# Patient Record
Sex: Male | Born: 1960 | Race: White | Hispanic: No
Health system: Southern US, Community
[De-identification: ages and names within clinical notes are randomized; demographics above are authoritative.]

## PROBLEM LIST (undated history)

## (undated) DIAGNOSIS — I999 Unspecified disorder of circulatory system: Secondary | ICD-10-CM

## (undated) HISTORY — PX: TONSILLECTOMY: SUR1361

## (undated) HISTORY — PX: ELBOW SURGERY: SHX618

---

## 2005-02-07 ENCOUNTER — Ambulatory Visit (HOSPITAL_BASED_OUTPATIENT_CLINIC_OR_DEPARTMENT_OTHER): Admission: RE | Admit: 2005-02-07 | Discharge: 2005-02-07 | Payer: Self-pay | Admitting: Orthopedic Surgery

## 2007-04-19 ENCOUNTER — Emergency Department (HOSPITAL_COMMUNITY): Admission: EM | Admit: 2007-04-19 | Discharge: 2007-04-19 | Payer: Self-pay | Admitting: *Deleted

## 2007-07-24 ENCOUNTER — Emergency Department (HOSPITAL_COMMUNITY): Admission: EM | Admit: 2007-07-24 | Discharge: 2007-07-24 | Payer: Self-pay | Admitting: Emergency Medicine

## 2010-06-06 ENCOUNTER — Emergency Department (HOSPITAL_COMMUNITY)
Admission: EM | Admit: 2010-06-06 | Discharge: 2010-06-07 | Disposition: A | Discharge status: Home / Self Care | Payer: Self-pay | Attending: Emergency Medicine | Admitting: Emergency Medicine

## 2010-06-06 ENCOUNTER — Emergency Department (HOSPITAL_COMMUNITY): Payer: Self-pay

## 2010-06-06 DIAGNOSIS — R599 Enlarged lymph nodes, unspecified: Secondary | ICD-10-CM | POA: Insufficient documentation

## 2010-06-06 DIAGNOSIS — R1110 Vomiting, unspecified: Secondary | ICD-10-CM | POA: Insufficient documentation

## 2010-06-06 DIAGNOSIS — R05 Cough: Secondary | ICD-10-CM | POA: Insufficient documentation

## 2010-06-06 DIAGNOSIS — E860 Dehydration: Secondary | ICD-10-CM | POA: Insufficient documentation

## 2010-06-06 DIAGNOSIS — R5381 Other malaise: Secondary | ICD-10-CM | POA: Insufficient documentation

## 2010-06-06 DIAGNOSIS — M25429 Effusion, unspecified elbow: Secondary | ICD-10-CM | POA: Insufficient documentation

## 2010-06-06 DIAGNOSIS — R509 Fever, unspecified: Secondary | ICD-10-CM | POA: Insufficient documentation

## 2010-06-06 DIAGNOSIS — R059 Cough, unspecified: Secondary | ICD-10-CM | POA: Insufficient documentation

## 2010-06-06 LAB — URINALYSIS, ROUTINE W REFLEX MICROSCOPIC
Bilirubin Urine: NEGATIVE
Glucose, UA: NEGATIVE mg/dL
Ketones, ur: NEGATIVE mg/dL
pH: 5.5 (ref 5.0–8.0)

## 2010-06-06 LAB — DIFFERENTIAL
Basophils Absolute: 0.1 10*3/uL (ref 0.0–0.1)
Basophils Relative: 1 % (ref 0–1)
Monocytes Absolute: 1.5 10*3/uL — ABNORMAL HIGH (ref 0.1–1.0)
Neutro Abs: 5.4 10*3/uL (ref 1.7–7.7)
Neutrophils Relative %: 54 % (ref 43–77)

## 2010-06-06 LAB — COMPREHENSIVE METABOLIC PANEL
ALT: 27 U/L (ref 0–53)
AST: 51 U/L — ABNORMAL HIGH (ref 0–37)
CO2: 27 mEq/L (ref 19–32)
Calcium: 8.4 mg/dL (ref 8.4–10.5)
GFR calc Af Amer: >60 mL/min (ref >60)
GFR calc non Af Amer: >60 mL/min (ref >60)
Potassium: 5.8 mEq/L — ABNORMAL HIGH (ref 3.5–5.1)
Sodium: 134 mEq/L — ABNORMAL LOW (ref 135–145)
Total Protein: 9.4 g/dL — ABNORMAL HIGH (ref 6.0–8.3)

## 2010-06-06 LAB — CBC
Hemoglobin: 13.7 g/dL (ref 13.0–17.0)
MCHC: 34.2 g/dL (ref 30.0–36.0)
WBC: 9.9 10*3/uL (ref 4.0–10.5)

## 2010-07-18 ENCOUNTER — Other Ambulatory Visit (HOSPITAL_COMMUNITY): Payer: Self-pay | Admitting: Orthopaedic Surgery

## 2010-07-18 DIAGNOSIS — M7989 Other specified soft tissue disorders: Secondary | ICD-10-CM

## 2010-07-21 ENCOUNTER — Other Ambulatory Visit (HOSPITAL_COMMUNITY): Payer: Self-pay

## 2010-07-23 ENCOUNTER — Ambulatory Visit (HOSPITAL_COMMUNITY)
Admission: RE | Admit: 2010-07-23 | Discharge: 2010-07-23 | Disposition: A | Discharge status: Home / Self Care | Payer: Self-pay | Source: Ambulatory Visit | Attending: Orthopaedic Surgery | Admitting: Orthopaedic Surgery

## 2010-07-23 DIAGNOSIS — M7989 Other specified soft tissue disorders: Secondary | ICD-10-CM

## 2010-07-23 DIAGNOSIS — R229 Localized swelling, mass and lump, unspecified: Secondary | ICD-10-CM | POA: Insufficient documentation

## 2010-07-23 MED ORDER — GADOBENATE DIMEGLUMINE 529 MG/ML IV SOLN
13.0000 mL | Freq: Once | INTRAVENOUS | Status: AC | PRN
  Administered 2010-07-23: 13 mL via INTRAVENOUS

## 2010-08-11 ENCOUNTER — Other Ambulatory Visit: Payer: Self-pay | Admitting: Orthopaedic Surgery

## 2010-08-11 ENCOUNTER — Ambulatory Visit (HOSPITAL_COMMUNITY)
Admission: RE | Admit: 2010-08-11 | Discharge: 2010-08-11 | Disposition: A | Discharge status: Home / Self Care | Payer: Self-pay | Source: Ambulatory Visit | Attending: Orthopaedic Surgery | Admitting: Orthopaedic Surgery

## 2010-08-11 DIAGNOSIS — L918 Other hypertrophic disorders of the skin: Secondary | ICD-10-CM | POA: Insufficient documentation

## 2010-08-11 DIAGNOSIS — Z01812 Encounter for preprocedural laboratory examination: Secondary | ICD-10-CM | POA: Insufficient documentation

## 2010-08-11 LAB — CBC
HCT: 38.7 % — ABNORMAL LOW (ref 39.0–52.0)
Hemoglobin: 12.7 g/dL — ABNORMAL LOW (ref 13.0–17.0)
MCH: 29.2 pg (ref 26.0–34.0)
MCHC: 32.8 g/dL (ref 30.0–36.0)
MCV: 89 fL (ref 78.0–100.0)

## 2010-08-11 LAB — BASIC METABOLIC PANEL
BUN: 10 mg/dL (ref 6–23)
CO2: 29 mEq/L (ref 19–32)
Chloride: 103 mEq/L (ref 96–112)
Glucose, Bld: 69 mg/dL — ABNORMAL LOW (ref 70–99)
Potassium: 4.5 mEq/L (ref 3.5–5.1)

## 2010-08-11 LAB — SURGICAL PCR SCREEN: Staphylococcus aureus: NEGATIVE

## 2010-08-13 LAB — WOUND CULTURE

## 2010-08-19 NOTE — Op Note (Signed)
NAME:  Sean [name], Sean [name] NO.:  [id number]   MEDICAL RECORD NO.:  [id number]          PATIENT TYPE:  AMB   LOCATION:  DSC                          FACILITY:  [facility]   PHYSICIAN:  Sean Fitch. [name], M.D. DATE OF BIRTH:  1960/07/08   DATE OF PROCEDURE:  02/07/2005  DATE OF DISCHARGE:                                 OPERATIVE REPORT   PREOPERATIVE DIAGNOSIS:  Septic arthritis, right long finger proximal  interphalangeal joint due to dorsal laceration sustained on February 02, 2005.   POSTOPERATIVE DIAGNOSIS:  Septic arthritis, right long finger proximal  interphalangeal joint due to dorsal laceration sustained on February 02, 2005  with identification of purulent effusion within proximal interphalangeal  joint.   OPERATION:  Incision and drainage of right long finger proximal  interphalangeal joint followed by splinting and voluminous dressing of the  right long finger.   OPERATING SURGEON:  Sean Fitch. Sypher, MD   ASSISTANT:  Annye Rusk PA-C   ANESTHESIA:  2% lidocaine metacarpal head level block of right long finger.  No sedation was provided.  This was performed in the minor operating room  and [facility].   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   On February 02, 2005, he accidentally cut the dorsal aspect of his right long  finger and had copious bleeding.   He cleaned the wound well and protected it with a bandage.  He return to  work.  During the past 48 hours,  beginning on February 05, 2005, he began to  experience throbbing pain in the PIP joint.  Late on the afternoon of  February 06, 2005, he sought an urgent care evaluation and was given a  prescription for amoxicillin.   His tetanus immunization was noted to be up to date.   He subsequently developed throbbing pain in the finger that became very  severe and he returned to [facility]  earlier today and was evaluated by one  of their urgent care physicians.  He was noted to have marked swelling of  his finger, increasing rubor and increasing pain.  He was referred for an  urgent Hand Surgery consult.   We advised him to come directly to the [facility] for  evaluation, anticipating the need to incise and drain his joint.   Sean [name] was interviewed in the holding area and found to have an  obviously infected right long finger PIP joint.   He had eaten breakfast at 6 a.m., therefore we advised him to wait until  after 12:30 p.m. prior to undergoing anesthesia and his surgical procedure.   He is brought to the operating room at this time, anticipating incision and  drainage of his right long finger PIP joint.   PROCEDURE:  Sean [name] was brought to the operating room and placed in  supine position upon the operating table.  Following Betadine prep of his  right palm and dorsal hand, 2% lidocaine was infiltrated at the metacarpal  head level to obtain a digital block.   After  15 minutes, he had complete anesthesia of the right long finger.   The right arm was then prepped with Betadine soap and solution and sterilely  draped.  A pneumatic tourniquet was applied to the proximal right brachium.   Following elevation and compression of his arm, the arterial tourniquet was  inflated to 230 mmHg.   The procedure commenced with extension of his traumatic transverse wound  along the radial dorsal aspect of the finger approximately 15 mm.   The PIP region was explored.  There was an tin-can-lid-type laceration  through the central slip of the extensor mechanism with purulent material  draining from the joint.   A rongeur was used to remove granulation tissue and purulent material,  followed by use of tenotomy scissors to dissect into the capsule of the PIP  joint.   A window was created into dorsal aspect of the PIP joint measuring   approximately 6 mm in length and 5 mm in width.   The joint was then milked with recovery of gross pus.   The joint was then irrigated with approximately 150 mL of sterile saline  using a fine dental needle along the recesses of the proximal phalangeal  head and into the space between the proximal and middle phalanx.  The joint  was repeatedly irrigated and massaged and irrigated until the effluent was  clear.   The wound was then dressed open with Xeroflo, sterile gauze and a voluminous  dressing including an Alumafoam splint.  There were no apparent  complications.   Sean [name] will begin doxycycline 100 mg p.o. twice daily x10 days.  He  will return to our office for followup in 24 hours for whirlpool bath.   Should he show continued signs of sepsis, we may need to perform a formal  incision and drainage in the traditional operating room setting to allow  both a volar and dorsal approach to his finger.   There were no apparent complications.   For aftercare, he is advised to elevate his hand and work on range of motion  exercises to his adjacent fingers.      Sean [name], M.D.  Electronically Signed     RVS/MEDQ  D:  02/07/2005  T:  02/08/2005  Job:  [id number]

## 2010-08-20 NOTE — H&P (Signed)
NAME:  Sean [name] NO.:  [id number]  MEDICAL RECORD NO.:  [id number]           PATIENT TYPE:  O  LOCATION:  SDSC                         FACILITY:  [facility]  PHYSICIAN:  Vanita Panda. Magnus Ivan, M.D.DATE OF BIRTH:  03-24-1961  DATE OF ADMISSION:  08/11/2010 DATE OF DISCHARGE:  08/11/2010                             HISTORY & PHYSICAL   CHIEF COMPLAINT:  Recurrent left elbow mass.  HISTORY OF PRESENT ILLNESS:  Mr. Sean [name] is a 50 year old gentleman was seen in the office.  He has a mass in the antecubital fossa measuring about 3 x 4.7 x 4.7.  It was question whether that this is an abscess.  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  I think he has alcohol history as well.  I was able to place a needle on this in the office and aspirated brown fluid from this, more consistent with infection.  I did sent it off, but anaerobic cultures came back negative.  The aerobic cultures were not done.  He has been on antibiotics.  After decompressing, it did reoccur.  There is suspicion that this is an abscess versus some type of necrotic process, either a chronic lymph node.  We recommended he undergo irrigation and debridement at this standpoint with excision of the mass and possible pathologic identification.  Risks and benefits were explained to him in length and he does wish to proceed with surgery.  CURRENT MEDICATIONS: 1. Ampicillin. 2. Doxycycline. 3. Tramadol.  ALLERGIES:  No known drug allergies.  FAMILY MEDICAL HISTORY:  Heart disease, diabetes, high blood pressure.  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  He currently does not smoke and he does drink, he says twice a week.  He did smell of alcohol when I saw him in the office before.  REVIEW OF SYSTEMS:  Negative for chest pain, shortness of breath, fevers, chills, nausea, or vomiting.  PHYSICAL EXAMINATION:  VITAL SIGNS:  He is afebrile.  Stable vital signs. GENERAL:  He is  alert, awake and oriented x3, in no acute distress or obvious discomfort. HEENT:  Normocephalic and atraumatic.  Pupils equal, round, reactive to light.  Extraocular muscles intact. NECK:  Supple. LUNGS:  Clear to auscultation bilaterally. HEART:  Regular rate and rhythm. ABDOMEN:  Benign. EXTREMITIES:  Right arm shows a probable soft tissue mass in the antecubital fossa just medial side.  He is neurovascularly intact. There is no redness associated with this and some mild pain.  MRI was reviewed and does show a large irregular mass in the fluid collection of the subcutaneous fat in the medial epitrochlear area of the elbow suspicious for abscess versus necrotic lymph node versus hematoma versus necrotic neoplasm.  ASSESSMENT:  This is a 50 year old gentleman with a recurrent right elbow soft tissue mass.  PLAN:  We will take him to the operating room today for exploration of this area with excision and debridement with pathologic identification as necessary.     Vanita Panda. Magnus Ivan, M.D.     CYB/MEDQ  D:  08/11/2010  T:  08/11/2010  Job:  [id number]  Electronically Signed by  Doneen Poisson M.D. on 08/20/2010 07:11:10 PM

## 2010-08-20 NOTE — Op Note (Signed)
  NAME:  DARRAGH, NAY NO.:  [id number]  MEDICAL RECORD NO.:  [id number]           PATIENT TYPE:  O  LOCATION:  SDSC                         FACILITY:  [facility]  PHYSICIAN:  Vanita Panda. Magnus Ivan, M.D.DATE OF BIRTH:  1960-08-24  DATE OF PROCEDURE:  08/11/2010 DATE OF DISCHARGE:  08/11/2010                              OPERATIVE REPORT   PREOPERATIVE DIAGNOSIS:  Right elbow antecubital fossa mass.  POSTOPERATIVE DIAGNOSIS:  Right elbow antecubital fossa mass.  PROCEDURE:  Excision, biopsy, and irrigation and debridement of right elbow tissue mass.  FINDINGS:  Findings consistent with right elbow antecubital fossa abscess versus necrotic lymph node.  SURGEON:  Vanita Panda. Magnus Ivan, MD  ANESTHESIA: 1. General. 2. Local with 0.25% plain Sensorcaine.  BLOOD LOSS:  Less than 100 mL.  COMPLICATIONS:  None.  INDICATIONS:  Mr. Silvera is a 50 year old gentleman with recurrentright elbow soft tissue mass in the antecubital fossa.  He is admitted using IV drugs years ago, but this has not been recent.  He developed this area in the antecubital fossa.  I obtained an MRI and the MRI was consistent with fluid collection in the subcutaneous epitrochlear area consistent with either necrotic lymph node versus abscess.  I aspirated this in the office and found abundant brown fluid.  I sent this for cultures, which were negative, but we also sent these for anaerobic and inadvertently not aerobic.  He presents now for excision of this mass with biopsy and exploration.  He understands the risks and benefits of the surgery as well.  PROCEDURE DESCRIPTION:  After informed consent was obtained, the appropriate right arm was marked.  He was brought to the operating room and placed supine on the operating table with the right arm on an arm table.  General anesthesia was then obtained.  His right arm was prepped and draped with ChloraPrep and sterile drapes.  A time-out  was called to identify correct patient and correct right arm.  I then made an incision directly over the mass and dissected down and once I got through capsular part of it, there was abundant fluid, which we sent for cultures.  There seemed to be necrotic tissue consistent with a lymph node.  I sent them as tissue off for pathologic identification with permanent section.  I then removed some old necrotic tissue and found the remainder of the structures in the medial aspect of the elbow intact.  I thoroughly irrigated the tissue with normal saline solution and then I closed the deep tissue with 2-0 Vicryl suture followed by interrupted 3-0 nylon on the skin.  I infiltrated this with 0.25% plan Sensorcaine.  I placed Xeroform followed by well-padded sterile dressing.  He was awakened, extubated, and taken to the recovery room in stable condition.  All final counts were correct.  No complications noted.     Vanita Panda. Magnus Ivan, M.D.     CYB/MEDQ  D:  08/11/2010  T:  08/12/2010  Job:  [id number]  Electronically Signed by Doneen Poisson M.D. on 08/20/2010 07:11:07 PM

## 2010-12-15 ENCOUNTER — Other Ambulatory Visit (HOSPITAL_COMMUNITY): Payer: PRIVATE HEALTH INSURANCE

## 2010-12-22 ENCOUNTER — Ambulatory Visit (HOSPITAL_COMMUNITY)
Admission: RE | Admit: 2010-12-22 | Payer: PRIVATE HEALTH INSURANCE | Source: Ambulatory Visit | Admitting: Otolaryngology

## 2010-12-27 LAB — URINE MICROSCOPIC-ADD ON

## 2010-12-27 LAB — URINALYSIS, ROUTINE W REFLEX MICROSCOPIC
Glucose, UA: NEGATIVE
Hgb urine dipstick: NEGATIVE
Protein, ur: 30 — AB

## 2010-12-27 LAB — INFLUENZA A+B VIRUS AG-DIRECT(RAPID)

## 2011-12-24 ENCOUNTER — Emergency Department (HOSPITAL_COMMUNITY)
Admission: EM | Admit: 2011-12-24 | Discharge: 2011-12-24 | Disposition: A | Discharge status: Home / Self Care | Payer: Self-pay | Attending: Emergency Medicine | Admitting: Emergency Medicine

## 2011-12-24 ENCOUNTER — Encounter (HOSPITAL_COMMUNITY): Payer: Self-pay | Admitting: *Deleted

## 2011-12-24 DIAGNOSIS — S2341XA Sprain of ribs, initial encounter: Secondary | ICD-10-CM | POA: Insufficient documentation

## 2011-12-24 DIAGNOSIS — S39012A Strain of muscle, fascia and tendon of lower back, initial encounter: Secondary | ICD-10-CM

## 2011-12-24 DIAGNOSIS — Y9389 Activity, other specified: Secondary | ICD-10-CM | POA: Insufficient documentation

## 2011-12-24 DIAGNOSIS — X500XXA Overexertion from strenuous movement or load, initial encounter: Secondary | ICD-10-CM | POA: Insufficient documentation

## 2011-12-24 DIAGNOSIS — S29011A Strain of muscle and tendon of front wall of thorax, initial encounter: Secondary | ICD-10-CM

## 2011-12-24 DIAGNOSIS — S339XXA Sprain of unspecified parts of lumbar spine and pelvis, initial encounter: Secondary | ICD-10-CM | POA: Insufficient documentation

## 2011-12-24 DIAGNOSIS — Y998 Other external cause status: Secondary | ICD-10-CM | POA: Insufficient documentation

## 2011-12-24 MED ORDER — CYCLOBENZAPRINE HCL 5 MG PO TABS: 5.0000 mg | ORAL_TABLET | Freq: Three times a day (TID) | ORAL | Status: DC | PRN

## 2011-12-24 MED ORDER — IBUPROFEN 800 MG PO TABS: 800.0000 mg | ORAL_TABLET | Freq: Three times a day (TID) | ORAL | Status: DC | PRN

## 2011-12-24 MED ORDER — IBUPROFEN 400 MG PO TABS
800.0000 mg | ORAL_TABLET | Freq: Once | ORAL | Status: AC
  Administered 2011-12-24: 800 mg via ORAL
  Filled 2011-12-24: qty 2

## 2011-12-24 MED ORDER — OXYCODONE-ACETAMINOPHEN 5-325 MG PO TABS: ORAL_TABLET | ORAL | Status: DC

## 2011-12-24 MED ORDER — OXYCODONE-ACETAMINOPHEN 5-325 MG PO TABS
2.0000 | ORAL_TABLET | Freq: Once | ORAL | Status: AC
  Administered 2011-12-24: 2 via ORAL
  Filled 2011-12-24: qty 2

## 2011-12-24 NOTE — ED Provider Notes (Signed)
History   This chart was scribed for Sean Numbers, MD by Melba Coon. The patient was seen in room TR09C/TR09C and the patient's care was started at 2:33PM.    CSN: [id number]  Arrival date & time 12/24/11  1358   First MD Initiated Contact with Patient 12/24/11 1427      Chief Complaint  Patient presents with  . Back Pain  . Generalized Body Aches    (Consider location/radiation/quality/duration/timing/severity/associated sxs/prior treatment) The history is provided by the patient. No language interpreter was used.   Sean [name] is a 51 y.o. male who presents to the Emergency Department complaining of constant, moderate to severe bilateral lower back pain with associated chest pain with an onset 3 days ago. [name] was pulling a furnace when he aggravated his back and chest. Pt rates the severity of the pain an 8/10. Ibuprofen slightly, but not completely alleviates the pain. Laying down aggravates the pain. No incontinence, bowel or bladder dysfunction. No extremity pain, swelling, numbness or tingling. No other pertinent medical symptoms.  History reviewed. No pertinent past medical history.  Past Surgical History  Procedure Date  . Elbow surgery   . Tonsillectomy     History reviewed. No pertinent family history.  History  Substance Use Topics  . Smoking status: Never Smoker   . Smokeless tobacco: Not on file  . Alcohol Use: Yes      Review of Systems 10 Systems reviewed and all are negative for acute change except as noted in the HPI.   Allergies  Motrin makes him nauseous.  Home Medications  No current outpatient prescriptions on file.  BP 147/88  Pulse 90  Temp 98.2 F (36.8 C) (Oral)  Resp 20  SpO2 98%  Physical Exam GEN: Well-developed, well-nourished male in mild distress HEENT: Atraumatic, normocephalic. Oropharynx clear without erythema EYES: PERRLA BL, no scleral icterus. NECK: Trachea midline, no meningismus CV: regular rate  and rhythm. No murmurs, rubs, or gallops PULM: Reproducible CP/TTP chest bilaterally that is worse with lifting. No respiratory distress.  No crackles, wheezes, or rales. GI: soft, non-tender. No guarding, rebound, or tenderness. + bowel sounds GU: deferred Neuro: cranial nerves grossly 2-12 intact, no abnormalities of strength or sensation, A and O x 3 MSK: Moderate paraspinal tenderness in lumbosacral area. Patient moves all 4 extremities symmetrically, no deformity, edema, or injury noted Skin: No rashes petechiae, purpura, or jaundice Psych: no abnormality of mood  ED Course  Procedures (including critical care time)  DIAGNOSTIC STUDIES: Oxygen Saturation is 98% on room air, normal by my interpretation.    COORDINATION OF CARE:  2:38PM - Ibuprofen, flexeril 5mg  30 tabs, and percocet 30 tabs will be Rx for the pt. Mr happ is ready for d/c.   Labs Reviewed - No data to display No results found.   1. Muscle strain of chest wall   2. Lumbosacral strain       MDM  Patient complained of muscular strain in chest wall and back following lifting a heavy HVAC unit.  I was comfortable that this was not ACS or other emergent causes of CP as was patient.  Patient denied new cough or fevers and was hemodynamically stable.  He was treated symptomatically and prescribed flexeril and percocet as well as ibuprofen.  Also given PCP referral list.  No neurologic findings or incontinence to suggest need for imaging today.    I personally performed the services described in this documentation, which was scribed in my presence. The  recorded information has been reviewed and considered.           Sean Numbers, MD 12/25/11 1011

## 2011-12-24 NOTE — ED Notes (Signed)
Patient states he was pulling a furnace thru an attic hole on Thursday.  He thinks he pulled something in his back.  He has pain in both side of his chest at times as well.  Patient also complains of leg pain.

## 2012-01-09 ENCOUNTER — Emergency Department (HOSPITAL_COMMUNITY): Payer: Self-pay

## 2012-01-09 ENCOUNTER — Encounter (HOSPITAL_COMMUNITY): Payer: Self-pay

## 2012-01-09 ENCOUNTER — Emergency Department (HOSPITAL_COMMUNITY)
Admission: EM | Admit: 2012-01-09 | Discharge: 2012-01-09 | Disposition: A | Discharge status: Home / Self Care | Payer: Self-pay | Attending: Emergency Medicine | Admitting: Emergency Medicine

## 2012-01-09 DIAGNOSIS — M2550 Pain in unspecified joint: Secondary | ICD-10-CM

## 2012-01-09 DIAGNOSIS — M79609 Pain in unspecified limb: Secondary | ICD-10-CM | POA: Insufficient documentation

## 2012-01-09 LAB — CBC WITH DIFFERENTIAL/PLATELET
Basophils Absolute: 0 10*3/uL (ref 0.0–0.1)
HCT: 40.3 % (ref 39.0–52.0)
Lymphocytes Relative: 18 % (ref 12–46)
Monocytes Absolute: 0.7 10*3/uL (ref 0.1–1.0)
Neutro Abs: 4.6 10*3/uL (ref 1.7–7.7)
Neutrophils Relative %: 69 % (ref 43–77)
Platelets: 452 10*3/uL — ABNORMAL HIGH (ref 150–400)
RDW: 13.5 % (ref 11.5–15.5)
WBC: 6.7 10*3/uL (ref 4.0–10.5)

## 2012-01-09 LAB — COMPREHENSIVE METABOLIC PANEL
ALT: 26 U/L (ref 0–53)
AST: 31 U/L (ref 0–37)
CO2: 29 mEq/L (ref 19–32)
Chloride: 100 mEq/L (ref 96–112)
GFR calc non Af Amer: >90 mL/min (ref >90)
Potassium: 4.2 mEq/L (ref 3.5–5.1)
Sodium: 136 mEq/L (ref 135–145)
Total Bilirubin: 0.3 mg/dL (ref 0.3–1.2)

## 2012-01-09 MED ORDER — OXYCODONE-ACETAMINOPHEN 5-325 MG PO TABS: 1.0000 | ORAL_TABLET | Freq: Four times a day (QID) | ORAL | Status: DC | PRN

## 2012-01-09 NOTE — ED Notes (Signed)
Pt complains of pain in all joints, had surgery a long time ago and fingers are cold. sts cant rest at night due to pain and cold hands.

## 2012-01-09 NOTE — ED Provider Notes (Signed)
History  Scribed for Sean Lennert, MD, the patient was seen in room TR05C/TR05C. This chart was scribed by Candelaria Stagers. The patient's care started at 3:49 PM   CSN: [id number]  Arrival date & time 01/09/12  1418   First MD Initiated Contact with Patient 01/09/12 1546      Chief Complaint  Patient presents with  . Joint Pain     Patient is a 51 y.o. male presenting with hand pain. The history is provided by the patient. No language interpreter was used.  Hand Pain This is a chronic problem. The current episode started more than 1 week ago. The problem occurs constantly. The problem has been gradually worsening. Pertinent negatives include no chest pain, no abdominal pain and no headaches. Nothing relieves the symptoms.   Sean [name] is a 51 y.o. male who presents to the Emergency Department complaining of joint pain all over that started about two weeks ago.  Pt states that his fingers started to feel cold about one week ago.  He is also waking up at night due to finger pain which has worsened over the past few days.  He has h/o elbow surgery to the right elbow.  He also reports drooping of his left eyelid.  Nothing seems to make the sx better or worse.    No past medical history on file.  Past Surgical History  Procedure Date  . Elbow surgery   . Tonsillectomy     No family history on file.  History  Substance Use Topics  . Smoking status: Never Smoker   . Smokeless tobacco: Not on file  . Alcohol Use: Yes      Review of Systems  Constitutional: Negative for fatigue.  HENT: Negative for congestion, sinus pressure and ear discharge.        Left eyelid drooping  Eyes: Negative for discharge.  Respiratory: Negative for cough.   Cardiovascular: Negative for chest pain.  Gastrointestinal: Negative for abdominal pain and diarrhea.  Genitourinary: Negative for frequency and hematuria.  Musculoskeletal: Positive for joint swelling (index fingers bilaterally )  and arthralgias (index fingers bilaterally). Negative for back pain.  Skin: Negative for rash.  Neurological: Negative for seizures and headaches.  Hematological: Negative.   Psychiatric/Behavioral: Negative for hallucinations.    Allergies  Review of patient's allergies indicates no known allergies.  Home Medications   Current Outpatient Rx  Name Route Sig Dispense Refill  . BC HEADACHE POWDER PO Oral Take 1 Package by mouth daily as needed. For headache    . CYCLOBENZAPRINE HCL 5 MG PO TABS Oral Take 5 mg by mouth 3 (three) times daily as needed. For pain    . IBUPROFEN 800 MG PO TABS Oral Take 800 mg by mouth every 8 (eight) hours as needed. For pain    . OXYCODONE-ACETAMINOPHEN 5-325 MG PO TABS Oral Take 0.5-1 tablets by mouth every 6 (six) hours as needed. for pain.      BP 156/101  Pulse 108  Temp 98.1 F (36.7 C) (Oral)  Resp 20  SpO2 100%  Physical Exam  Constitutional: He is oriented to person, place, and time. He appears well-developed.  HENT:  Head: Normocephalic.  Eyes: Conjunctivae normal are normal.  Neck: No tracheal deviation present.  Cardiovascular:  No murmur heard. Musculoskeletal: Normal range of motion.       Ptosis of eyelid on the left.  DIP joints of both index fingers are swollen and tender.   Neurological: He is oriented  to person, place, and time.  Skin: Skin is warm.  Psychiatric: He has a normal mood and affect.    ED Course  Procedures   DIAGNOSTIC STUDIES: Oxygen Saturation is 100% on room air, normal by my interpretation.    COORDINATION OF CARE:  15:54 Ordered: DG Finger Index Right; CBC with Differential; Comprehensive metabolic panel 5:47 PM Discussed results and course of care including f/u with PCP  Labs Reviewed  CBC WITH DIFFERENTIAL - Abnormal; Notable for the following:    Platelets 452 (*)     All other components within normal limits  COMPREHENSIVE METABOLIC PANEL - Abnormal; Notable for the following:    Total  Protein 9.1 (*)     Albumin 3.1 (*)     All other components within normal limits   Dg Finger Index Right  01/09/2012  *RADIOLOGY REPORT*  Clinical Data: Pain  RIGHT INDEX FINGER 2+V  Comparison: None.  Findings:  Frontal, oblique, and lateral views were obtained.  No fracture or dislocation.  Joint spaces appear intact.  No erosive change.  IMPRESSION:  No abnormality noted.   Original Report Authenticated By: Arvin Collard. WOODRUFF III, M.D.      No diagnosis found.    MDM   The chart was scribed for me under my direct supervision.  I personally performed the history, physical, and medical decision making and all procedures in the evaluation of this patient.Sean Lennert, MD 01/09/12 1750

## 2012-01-12 ENCOUNTER — Encounter (HOSPITAL_COMMUNITY): Payer: Self-pay | Admitting: *Deleted

## 2012-01-12 ENCOUNTER — Emergency Department (HOSPITAL_COMMUNITY)
Admission: EM | Admit: 2012-01-12 | Discharge: 2012-01-12 | Discharge status: Against Medical Advice | Payer: Self-pay | Attending: Emergency Medicine | Admitting: Emergency Medicine

## 2012-01-12 DIAGNOSIS — I998 Other disorder of circulatory system: Secondary | ICD-10-CM

## 2012-01-12 DIAGNOSIS — M79609 Pain in unspecified limb: Secondary | ICD-10-CM | POA: Insufficient documentation

## 2012-01-12 MED ORDER — OXYCODONE-ACETAMINOPHEN 10-325 MG PO TABS: 1.0000 | ORAL_TABLET | ORAL | Status: DC | PRN

## 2012-01-12 NOTE — ED Notes (Signed)
Pt is back here for severe pain in his bilateral index fingertips.  Pts tips of his fingers are cool to the touch and appear to have very small bruising to them

## 2012-01-13 ENCOUNTER — Encounter (HOSPITAL_COMMUNITY): Payer: Self-pay | Admitting: Emergency Medicine

## 2012-01-13 ENCOUNTER — Other Ambulatory Visit: Payer: Self-pay

## 2012-01-13 ENCOUNTER — Inpatient Hospital Stay (HOSPITAL_COMMUNITY)
Admission: EM | Admit: 2012-01-13 | Discharge: 2012-01-19 | DRG: 303 | Disposition: A | Discharge status: Home / Self Care | Payer: MEDICAID | Attending: Internal Medicine | Admitting: Internal Medicine

## 2012-01-13 DIAGNOSIS — F1910 Other psychoactive substance abuse, uncomplicated: Secondary | ICD-10-CM | POA: Diagnosis present

## 2012-01-13 DIAGNOSIS — I776 Arteritis, unspecified: Secondary | ICD-10-CM | POA: Diagnosis present

## 2012-01-13 DIAGNOSIS — I998 Other disorder of circulatory system: Secondary | ICD-10-CM | POA: Diagnosis present

## 2012-01-13 DIAGNOSIS — F1210 Cannabis abuse, uncomplicated: Secondary | ICD-10-CM | POA: Diagnosis present

## 2012-01-13 DIAGNOSIS — B1920 Unspecified viral hepatitis C without hepatic coma: Secondary | ICD-10-CM | POA: Diagnosis present

## 2012-01-13 DIAGNOSIS — R404 Transient alteration of awareness: Secondary | ICD-10-CM | POA: Diagnosis not present

## 2012-01-13 DIAGNOSIS — IMO0002 Reserved for concepts with insufficient information to code with codable children: Secondary | ICD-10-CM | POA: Diagnosis not present

## 2012-01-13 DIAGNOSIS — Z7982 Long term (current) use of aspirin: Secondary | ICD-10-CM

## 2012-01-13 DIAGNOSIS — F1410 Cocaine abuse, uncomplicated: Secondary | ICD-10-CM | POA: Diagnosis present

## 2012-01-13 DIAGNOSIS — M79609 Pain in unspecified limb: Secondary | ICD-10-CM

## 2012-01-13 DIAGNOSIS — R000 Tachycardia, unspecified: Secondary | ICD-10-CM | POA: Diagnosis not present

## 2012-01-13 DIAGNOSIS — F1010 Alcohol abuse, uncomplicated: Secondary | ICD-10-CM | POA: Diagnosis present

## 2012-01-13 DIAGNOSIS — M2550 Pain in unspecified joint: Secondary | ICD-10-CM | POA: Diagnosis present

## 2012-01-13 DIAGNOSIS — R509 Fever, unspecified: Secondary | ICD-10-CM | POA: Diagnosis not present

## 2012-01-13 DIAGNOSIS — I999 Unspecified disorder of circulatory system: Secondary | ICD-10-CM

## 2012-01-13 DIAGNOSIS — R9430 Abnormal result of cardiovascular function study, unspecified: Secondary | ICD-10-CM

## 2012-01-13 DIAGNOSIS — I731 Thromboangiitis obliterans [Buerger's disease]: Secondary | ICD-10-CM | POA: Diagnosis present

## 2012-01-13 DIAGNOSIS — Z791 Long term (current) use of non-steroidal anti-inflammatories (NSAID): Secondary | ICD-10-CM

## 2012-01-13 DIAGNOSIS — D698 Other specified hemorrhagic conditions: Secondary | ICD-10-CM | POA: Diagnosis present

## 2012-01-13 LAB — PROTIME-INR
INR: 1.15 (ref 0.00–1.49)
Prothrombin Time: 14.5 seconds (ref 11.6–15.2)

## 2012-01-13 LAB — CBC
HCT: 39.4 % (ref 39.0–52.0)
Hemoglobin: 12.7 g/dL — ABNORMAL LOW (ref 13.0–17.0)
MCH: 28.7 pg (ref 26.0–34.0)
MCHC: 32.2 g/dL (ref 30.0–36.0)

## 2012-01-13 LAB — APTT: aPTT: 55 seconds — ABNORMAL HIGH (ref 24–37)

## 2012-01-13 LAB — RAPID URINE DRUG SCREEN, HOSP PERFORMED
Opiates: NOT DETECTED
Tetrahydrocannabinol: NOT DETECTED

## 2012-01-13 MED ORDER — HEPARIN BOLUS VIA INFUSION
3000.0000 [IU] | Freq: Once | INTRAVENOUS | Status: AC
  Administered 2012-01-13: 3000 [IU] via INTRAVENOUS

## 2012-01-13 MED ORDER — OXYCODONE-ACETAMINOPHEN 5-325 MG PO TABS
1.0000 | ORAL_TABLET | ORAL | Status: DC | PRN
  Administered 2012-01-14 – 2012-01-19 (×22): 1 via ORAL
  Filled 2012-01-13 (×22): qty 1

## 2012-01-13 MED ORDER — ACETAMINOPHEN 325 MG PO TABS
650.0000 mg | ORAL_TABLET | Freq: Four times a day (QID) | ORAL | Status: DC | PRN
  Administered 2012-01-14: 650 mg via ORAL
  Filled 2012-01-13 (×2): qty 2

## 2012-01-13 MED ORDER — OXYCODONE-ACETAMINOPHEN 10-325 MG PO TABS: 1.0000 | ORAL_TABLET | ORAL | Status: DC | PRN

## 2012-01-13 MED ORDER — OXYCODONE HCL 5 MG PO TABS
5.0000 mg | ORAL_TABLET | ORAL | Status: DC | PRN
  Administered 2012-01-14 – 2012-01-19 (×18): 5 mg via ORAL
  Filled 2012-01-13 (×13): qty 1

## 2012-01-13 MED ORDER — HEPARIN (PORCINE) IN NACL 100-0.45 UNIT/ML-% IJ SOLN
1150.0000 [IU]/h | INTRAMUSCULAR | Status: DC
  Administered 2012-01-13: 900 [IU]/h via INTRAVENOUS
  Administered 2012-01-14: 1150 [IU]/h via INTRAVENOUS
  Filled 2012-01-13 (×3): qty 250

## 2012-01-13 MED ORDER — ALUM & MAG HYDROXIDE-SIMETH 200-200-20 MG/5ML PO SUSP: 30.0000 mL | Freq: Four times a day (QID) | ORAL | Status: DC | PRN

## 2012-01-13 MED ORDER — SENNOSIDES-DOCUSATE SODIUM 8.6-50 MG PO TABS: 1.0000 | ORAL_TABLET | Freq: Every evening | ORAL | Status: DC | PRN

## 2012-01-13 MED ORDER — ACETAMINOPHEN 650 MG RE SUPP: 650.0000 mg | Freq: Four times a day (QID) | RECTAL | Status: DC | PRN

## 2012-01-13 MED ORDER — HYDROCODONE-ACETAMINOPHEN 5-325 MG PO TABS
2.0000 | ORAL_TABLET | Freq: Once | ORAL | Status: AC
  Administered 2012-01-13: 2 via ORAL
  Filled 2012-01-13: qty 2

## 2012-01-13 MED ORDER — MORPHINE SULFATE 4 MG/ML IJ SOLN
4.0000 mg | INTRAMUSCULAR | Status: DC | PRN
  Administered 2012-01-13 – 2012-01-16 (×10): 4 mg via INTRAVENOUS
  Filled 2012-01-13 (×11): qty 1

## 2012-01-13 MED ORDER — SODIUM CHLORIDE 0.9 % IV SOLN
INTRAVENOUS | Status: DC
  Administered 2012-01-13: via INTRAVENOUS
  Administered 2012-01-14 (×2): 125 mL via INTRAVENOUS
  Administered 2012-01-15: 11:00:00 via INTRAVENOUS
  Administered 2012-01-16: 20 mL/h via INTRAVENOUS

## 2012-01-13 MED ORDER — OXYCODONE HCL 5 MG PO TABS
5.0000 mg | ORAL_TABLET | ORAL | Status: DC | PRN
  Administered 2012-01-14 – 2012-01-19 (×6): 5 mg via ORAL
  Filled 2012-01-13 (×10): qty 1

## 2012-01-13 MED ORDER — ZOLPIDEM TARTRATE 5 MG PO TABS
5.0000 mg | ORAL_TABLET | Freq: Every evening | ORAL | Status: DC | PRN
  Administered 2012-01-15: 5 mg via ORAL
  Filled 2012-01-13 (×2): qty 1

## 2012-01-13 MED ORDER — ONDANSETRON HCL 4 MG PO TABS: 4.0000 mg | ORAL_TABLET | Freq: Four times a day (QID) | ORAL | Status: DC | PRN

## 2012-01-13 MED ORDER — ONDANSETRON HCL 4 MG/2ML IJ SOLN: 4.0000 mg | Freq: Four times a day (QID) | INTRAMUSCULAR | Status: DC | PRN

## 2012-01-13 MED ORDER — SODIUM CHLORIDE 0.9 % IJ SOLN
3.0000 mL | Freq: Two times a day (BID) | INTRAMUSCULAR | Status: DC
  Administered 2012-01-14 – 2012-01-19 (×9): 3 mL via INTRAVENOUS

## 2012-01-13 NOTE — ED Notes (Signed)
Lab at bedside

## 2012-01-13 NOTE — ED Notes (Signed)
Pt requesting pain medication, states "this pain is unbearable, you guys have to do something about this."

## 2012-01-13 NOTE — Progress Notes (Signed)
ANTICOAGULATION CONSULT NOTE - Initial Consult  Pharmacy Consult for Heparin Indication: Finger ischemia  No Known Allergies  Patient Measurements: Height: 5\' 5"  (165.1 cm) Weight: 140 lb (63.504 kg) IBW/kg (Calculated) : 61.5  Heparin Dosing Weight: 63 kg  Vital Signs: Temp: 97.6 F (36.4 C) (10/12 1348) Temp src: Oral (10/12 1348) BP: 130/87 mmHg (10/12 1348) Pulse Rate: 91  (10/12 1348)  Labs: No results found for this basename: HGB:2,HCT:3,PLT:3,APTT:3,LABPROT:3,INR:3,HEPARINUNFRC:3,CREATININE:3,CKTOTAL:3,CKMB:3,TROPONINI:3 in the last 72 hours  Estimated Creatinine Clearance: 89.5 ml/min (by C-G formula based on Cr of 0.84).   Medical History: History reviewed. No pertinent past medical history.   Assessment: 51 y.o. M with repeat presentations to the [facility] with complaints of finger pain. Now noted to have some swelling/discoloration. Pharmacy consulted to start heparin for finger ischemia. Per nurse report, pt states that his last surgery was "vein surgery" about 1.5 years ago. Hep wt: 63.5 kg  CBC not yet crossed into EPIC (system down) but per discussion with hematology: Hgb 12.7, Hct 39.4, Plt 495.   Goal of Therapy:  Heparin level 0.3-0.7 units/ml Monitor platelets by anticoagulation protocol: Yes   Plan:  1. Heparin bolus of 3000 units x 1  2. Initiate heparin drip at rate of 900 units/hr (9 ml/hr) 3. Will continue to monitor for any signs/symptoms of bleeding and will follow up with heparin level in 6 hours   Georgina Pillion, PharmD, BCPS Clinical Pharmacist Pager: [phone] 01/13/2012 6:30 PM

## 2012-01-13 NOTE — H&P (Signed)
PCP:   None   Chief Complaint:  Pain bilateral fingers  HPI: This is a healthy 51 year old gentleman who states that approximately 2 weeks ago he developed pain in the bilateral index fingers, pain was severe enough to provide sleep. He states the fingers were changing colors and meaning becoming a bluish hue. Pain has continued to be severe to today. He was in here 2 days ago with the same complaint, blood work and x-rays that were negative he was discharged home. He came by to see her last night, her ultrasound was done revealing decreased flow. 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 His back to the ER today. Admission the patient complains of generalized aches and pains severe in the back bending on the legs, severe enough to bring him from going to work for the past 2 weeks. He states this is also new. He denies any fevers or chills. He states he been having some episodes of nausea vomiting.   Review of Systems:  The patient denies anorexia, fever, weight loss,, vision loss, decreased hearing, hoarseness, chest pain, syncope, dyspnea on exertion, peripheral edema, balance deficits, hemoptysis, abdominal pain, melena, hematochezia, severe indigestion/heartburn, hematuria, incontinence, genital sores, muscle weakness, suspicious skin lesions, transient blindness, difficulty walking, depression, unusual weight change, abnormal bleeding, enlarged lymph nodes, angioedema, and breast masses.  Past Medical History: History reviewed. No pertinent past medical history. Past Surgical History  Procedure Date  . Elbow surgery   . Tonsillectomy     Medications: Prior to Admission medications   Medication Sig Start Date End Date Taking? Authorizing Provider  Aspirin-Salicylamide-Caffeine (BC HEADACHE POWDER PO) Take 1 Package by mouth daily as needed. For headache   Yes Historical Provider, MD    ibuprofen (ADVIL,MOTRIN) 800 MG tablet Take 800 mg by mouth every 8 (eight) hours as needed. For pain 12/24/11  Yes Cyndra Numbers, MD  oxyCODONE-acetaminophen (PERCOCET) 10-325 MG per tablet Take 1 tablet by mouth every 4 (four) hours as needed for pain. 01/12/12  Yes Flint Melter, MD    Allergies:  No Known Allergies  Social History:  reports that he has never smoked. He does not have any smokeless tobacco history on file. He reports that he drinks alcohol. He reports that he does not use illicit drugs.  Family History: No family history on file.  Physical Exam: Filed Vitals:   01/13/12 1348 01/13/12 1814  BP: 130/87   Pulse: 91   Temp: 97.6 F (36.4 C)   TempSrc: Oral   Resp: 18   Height:  5\' 5"  (1.651 m)  Weight:  63.504 kg (140 lb)  SpO2: 100%     General:  Alert and oriented times three, well developed and nourished, no acute distress Eyes: PERRLA, pink conjunctiva, no scleral icterus ENT: Moist oral mucosa, neck supple, no thyromegaly Lungs: clear to ascultation, no wheeze, no crackles, no use of accessory muscles Cardiovascular: regular rate and rhythm, no regurgitation, no gallops, no murmurs. No carotid bruits, no JVD Abdomen: soft, positive BS, non-tender, non-distended, no organomegaly, not an acute abdomen GU: not examined Neuro: CN II - XII grossly intact, sensation intact Musculoskeletal: strength 5/5 all extremities, no clubbing, cyanosis or edema Skin: no rash, no subcutaneous crepitation, no decubitus, left and right index fingers blue at the tips, cool and tender to touch, good radial pulses Psych: appropriate patient   Labs on Admission:  No results  found for this basename: NA:2,K:2,CL:2,CO2:2,GLUCOSE:2,BUN:2,CREATININE:2,CALCIUM:2,MG:2,PHOS:2 in the last 72 hours No results found for this basename: AST:2,ALT:2,ALKPHOS:2,BILITOT:2,PROT:2,ALBUMIN:2 in the last 72 hours No results found for this basename: LIPASE:2,AMYLASE:2 in the last 72 hours No  results found for this basename: WBC:2,NEUTROABS:2,HGB:2,HCT:2,MCV:2,PLT:2 in the last 72 hours No results found for this basename: CKTOTAL:3,CKMB:3,CKMBINDEX:3,TROPONINI:3 in the last 72 hours No components found with this basename: POCBNP:3 No results found for this basename: DDIMER:2 in the last 72 hours No results found for this basename: HGBA1C:2 in the last 72 hours No results found for this basename: CHOL:2,HDL:2,LDLCALC:2,TRIG:2,CHOLHDL:2,LDLDIRECT:2 in the last 72 hours No results found for this basename: TSH,T4TOTAL,FREET3,T3FREE,THYROIDAB in the last 72 hours No results found for this basename: VITAMINB12:2,FOLATE:2,FERRITIN:2,TIBC:2,IRON:2,RETICCTPCT:2 in the last 72 hours  Micro Results: No results found for this or any previous visit (from the past 240 hour(s)).   Radiological Exams on Admission: No results found.  Assessment/Plan Present on Admission:  .Ischemic bilateral finger Admit to  MedSurg Vascular surgeon Dr. Elliot Cousin aware. Will see patient in a.m. Angiogram ordered on heparin drip Pain medications as needed Nonspecific generalized aches and pains ESR and C-reactive protein ordered as well as Lyme's disease   please note: The labs are currently down  Full code No need for DVT prophylaxis   Ashyla Luth 01/13/2012, 9:27 PM

## 2012-01-13 NOTE — ED Notes (Signed)
Report called to [facility] on 5500.

## 2012-01-13 NOTE — ED Notes (Signed)
Pt. Stated, I was here 2 nights ago.  Fingers is not having any blood flow to fingers.  Here last night and the Dr. York Spaniel, I was not getting any blood flow to my finger tips. Dr. Catalina Pizza him to come back today.

## 2012-01-13 NOTE — ED Provider Notes (Signed)
0730 report received from Dr. not on file the palm this 51 year old male with worsening pain in his bilateral index finger tips with discoloration. Good sensation and movement to the fingers. States that this has been gradually getting worse over the last 2 weeks. He has been here a couple times for this problem. States that his trouble began 3 weeks ago when he had chest and back pain trying to cool air conditioning unit out of an attic. Patient denies shortness of breath calf pain or long trips. Patient was given heparin 300units in fast track.  Dr. Jed Limerick here to admit the patient.  Remi Haggard, NP 01/13/12 2213

## 2012-01-13 NOTE — ED Notes (Signed)
Hospital medicine in to evaluate pt

## 2012-01-13 NOTE — ED Provider Notes (Addendum)
History    This chart was scribed for Derwood Kaplan, MD, MD by Smitty Pluck. The patient was seen in room TR04C and the patient's care was started at 3:33PM.   CSN: [id number]  Arrival date & time 01/13/12  1340   None     No chief complaint on file.   (Consider location/radiation/quality/duration/timing/severity/associated sxs/prior treatment) The history is provided by the patient. No language interpreter was used.   Sean [name] is a 51 y.o. male who presents to the Emergency Department complaining of constant, moderate numbness of fingers in bilateral hands onset 1.5 weeks ago. Reports gradual onset with right index finger having the most pain. He denies aggravating and relieving factors. Reports applying peroxide and alcohol without relief. He states he has discoloration of fingers. Pt reports that he was in ED 2 days ago and 1 day ago due to same symptoms and had xray of hands. Pt reports that he was discharged with percocet. He reports he had numb "pins and needles" sensation after discharge. Pt reports taking BC. Pt denies any other medical problems. He reports hx of right elbow surgery 1 year ago and states that he noticed numbness in right fingers intermittently.  Denies smoking cigarettes. Pt was told to come back today by physician that examined him 1 day ago.   History reviewed. No pertinent past medical history.  Past Surgical History  Procedure Date  . Elbow surgery   . Tonsillectomy     No family history on file.  History  Substance Use Topics  . Smoking status: Never Smoker   . Smokeless tobacco: Not on file  . Alcohol Use: Yes      Review of Systems  Neurological: Positive for numbness.  All other systems reviewed and are negative.    Allergies  Review of patient's allergies indicates no known allergies.  Home Medications   Current Outpatient Rx  Name Route Sig Dispense Refill  . BC HEADACHE POWDER PO Oral Take 1 Package by mouth daily as  needed. For headache    . IBUPROFEN 800 MG PO TABS Oral Take 800 mg by mouth every 8 (eight) hours as needed. For pain    . OXYCODONE-ACETAMINOPHEN 10-325 MG PO TABS Oral Take 1 tablet by mouth every 4 (four) hours as needed for pain. 10 tablet 0    BP 130/87  Pulse 91  Temp 97.6 F (36.4 C) (Oral)  Resp 18  SpO2 100%  Physical Exam  Nursing note and vitals reviewed. Constitutional: He is oriented to person, place, and time. He appears well-developed and well-nourished. No distress.  HENT:  Head: Normocephalic and atraumatic.  Neck: Normal range of motion. Neck supple.  Cardiovascular: Normal rate, regular rhythm and normal heart sounds.   Pulmonary/Chest: Effort normal and breath sounds normal. No respiratory distress. He has no wheezes. He has no rales.  Musculoskeletal:       Left and right upper extremity +2 radial pulse Can not palpate the ulnar pulse on left Skin in both hands is warm Mild swelling of hands Index finger bilaterally has redness to skin most profound in distal aspect of digits both tips of index finger have hyperpigmentation and purple skin color ROM is nl for fingers    Poor cap refill in bilateral index fingers    Neurological: He is alert and oriented to person, place, and time.  Skin: Skin is warm and dry.  Psychiatric: He has a normal mood and affect. His behavior is normal.  ED Course  Procedures (including critical care time) DIAGNOSTIC STUDIES: Oxygen Saturation is 100% on room air, normal by my interpretation.    COORDINATION OF CARE: 3:46 PM Discussed ED treatment with pt     Labs Reviewed - No data to display No results found.   No diagnosis found.    MDM  Medical screening examination/treatment/procedure(s) were performed by me as the supervising physician. Scribe service was utilized for documentation only.  Pt comes in with cc of finger pain. He has been to the ER 3 times now for the same complains, as he has no pcp. :ast  visit was y'day, diagnosed with ischemia.  He has no medical hx, does have a lot of 2nd hand smoking exposure, and exam reveals all digits to be slightly swollen, but bilateral insex finger do show some hyperpigmentations. He also has constant burning type pain. My concerns are also for ischemia, but we have no etiology. He has no murmur, no cocaine abuse, doesn't appear to be embolic process. He has no HL, DM, no known vasculitis, PAD.  We will get basic lab workup, and get lactate, crp, sed rate. Will immerse the fingers in warm water. Will likely try to get wrist brachial index and [place].      Derwood Kaplan, MD 01/13/12 1625   Date: 01/13/2012  Rate:86  Rhythm: normal sinus rhythm  QRS Axis: normal  Intervals: normal  ST/T Wave abnormalities: normal  Conduction Disutrbances: none  Narrative Interpretation: unremarkable     Derwood Kaplan, MD 01/13/12 2022

## 2012-01-14 ENCOUNTER — Inpatient Hospital Stay (HOSPITAL_COMMUNITY): Payer: Self-pay

## 2012-01-14 ENCOUNTER — Encounter (HOSPITAL_COMMUNITY): Payer: Self-pay | Admitting: *Deleted

## 2012-01-14 DIAGNOSIS — I517 Cardiomegaly: Secondary | ICD-10-CM

## 2012-01-14 DIAGNOSIS — M79609 Pain in unspecified limb: Secondary | ICD-10-CM

## 2012-01-14 LAB — URINALYSIS, ROUTINE W REFLEX MICROSCOPIC
Bilirubin Urine: NEGATIVE
Glucose, UA: NEGATIVE mg/dL
Hgb urine dipstick: NEGATIVE
Specific Gravity, Urine: 1.009 (ref 1.005–1.030)
pH: 5 (ref 5.0–8.0)

## 2012-01-14 LAB — COMPREHENSIVE METABOLIC PANEL
Albumin: 2.7 g/dL — ABNORMAL LOW (ref 3.5–5.2)
Alkaline Phosphatase: 80 U/L (ref 39–117)
BUN: 10 mg/dL (ref 6–23)
CO2: 23 mEq/L (ref 19–32)
Chloride: 101 mEq/L (ref 96–112)
GFR calc non Af Amer: >90 mL/min (ref >90)
Potassium: 4.1 mEq/L (ref 3.5–5.1)
Total Bilirubin: 0.2 mg/dL — ABNORMAL LOW (ref 0.3–1.2)

## 2012-01-14 LAB — CBC
MCH: 28.9 pg (ref 26.0–34.0)
MCV: 88.2 fL (ref 78.0–100.0)
Platelets: 467 10*3/uL — ABNORMAL HIGH (ref 150–400)
RDW: 13.6 % (ref 11.5–15.5)
WBC: 6.1 10*3/uL (ref 4.0–10.5)

## 2012-01-14 LAB — HEPARIN LEVEL (UNFRACTIONATED)
Heparin Unfractionated: 0.1 IU/mL — ABNORMAL LOW (ref 0.30–0.70)
Heparin Unfractionated: 0.13 IU/mL — ABNORMAL LOW (ref 0.30–0.70)

## 2012-01-14 LAB — BASIC METABOLIC PANEL
BUN: 10 mg/dL (ref 6–23)
Chloride: 100 mEq/L (ref 96–112)
GFR calc Af Amer: >90 mL/min (ref >90)
GFR calc non Af Amer: >90 mL/min (ref >90)
Potassium: 3.9 mEq/L (ref 3.5–5.1)
Sodium: 135 mEq/L (ref 135–145)

## 2012-01-14 LAB — PROTIME-INR: Prothrombin Time: 14.8 seconds (ref 11.6–15.2)

## 2012-01-14 LAB — MAGNESIUM: Magnesium: 2 mg/dL (ref 1.5–2.5)

## 2012-01-14 MED ORDER — IBUPROFEN 600 MG PO TABS
600.0000 mg | ORAL_TABLET | ORAL | Status: DC
  Filled 2012-01-14: qty 1

## 2012-01-14 MED ORDER — HEPARIN (PORCINE) IN NACL 100-0.45 UNIT/ML-% IJ SOLN
1700.0000 [IU]/h | INTRAMUSCULAR | Status: DC
  Administered 2012-01-14 – 2012-01-15 (×2): 1550 [IU]/h via INTRAVENOUS
  Administered 2012-01-15: 1700 [IU]/h via INTRAVENOUS
  Filled 2012-01-14: qty 250

## 2012-01-14 MED ORDER — HEPARIN BOLUS VIA INFUSION
2000.0000 [IU] | Freq: Once | INTRAVENOUS | Status: AC
  Administered 2012-01-14: 2000 [IU] via INTRAVENOUS
  Filled 2012-01-14: qty 2000

## 2012-01-14 MED ORDER — KETOROLAC TROMETHAMINE 15 MG/ML IJ SOLN
15.0000 mg | Freq: Once | INTRAMUSCULAR | Status: AC
  Administered 2012-01-14: 15 mg via INTRAVENOUS
  Filled 2012-01-14: qty 1

## 2012-01-14 MED ORDER — HEPARIN (PORCINE) IN NACL 100-0.45 UNIT/ML-% IJ SOLN
1350.0000 [IU]/h | INTRAMUSCULAR | Status: DC
  Administered 2012-01-14: 1350 [IU]/h via INTRAVENOUS
  Filled 2012-01-14 (×2): qty 250

## 2012-01-14 MED ORDER — ASPIRIN 81 MG PO CHEW
81.0000 mg | CHEWABLE_TABLET | Freq: Every day | ORAL | Status: DC
  Administered 2012-01-14 – 2012-01-18 (×5): 81 mg via ORAL
  Filled 2012-01-14 (×5): qty 1

## 2012-01-14 MED ORDER — VANCOMYCIN HCL 1000 MG IV SOLR
750.0000 mg | Freq: Two times a day (BID) | INTRAVENOUS | Status: DC
  Administered 2012-01-14 – 2012-01-15 (×3): 750 mg via INTRAVENOUS
  Filled 2012-01-14 (×5): qty 750

## 2012-01-14 NOTE — Progress Notes (Signed)
ANTICOAGULATION CONSULT NOTE - Follow Up Consult  Pharmacy Consult for heparin Indication: finger ischemia  Labs:  Basename 01/14/12 0908 01/14/12 0245 01/13/12 2257 01/13/12 2024 01/13/12 1629  HGB -- 12.0* -- -- 12.7*  HCT -- 36.6* -- -- 39.4  PLT -- 467* -- -- 495*  APTT -- -- -- 55* --  LABPROT -- 14.8 -- 14.5 --  INR -- 1.18 -- 1.15 --  HEPARINUNFRC 0.10* <0.10* -- -- --  CREATININE -- 0.96 0.89 -- --  CKTOTAL -- -- -- -- --  CKMB -- -- -- -- --  TROPONINI -- -- -- -- --    Assessment: 51 yo male to continue on heparin for finger ischemia. Heparin level 0.10 on 1150 units/hr is subtherapeutic despite rate increase. Heparin wt 58.7 kg. Hgb 12.7>12.0, plt 495>467, will continue to follow trend. No bleeding or line issues per RN.   Goal of Therapy:  Heparin level 0.3-0.7 units/ml Monitor platelets by anticoagulation protocol: Yes  Plan:  Will rebolus with heparin 2000 units x1  Increase gtt to 1350 units/hr (=13.5 ml/hr) Check heparin level in 6hr at 1700 Daily heparin level and CBC  Lorelei Heikkila L  01/14/2012,10:40 AM

## 2012-01-14 NOTE — ED Provider Notes (Signed)
Medical screening examination/treatment/procedure(s) were performed by non-physician practitioner and as supervising physician I was immediately available for consultation/collaboration.  Hema Lanza, MD 01/14/12 1255 

## 2012-01-14 NOTE — Consult Note (Addendum)
[facility]      Consult Note  Patient name: Sean [name] MRN: [id number] DOB: [birth year] Sex: male  Consulting Physician:  Emergency department  Reason for Consult: No chief complaint on file.   HISTORY OF PRESENT ILLNESS: This is a 51 year old gentleman who began having problems approximately 2 weeks ago. He experienced pain in both index fingers. He has been seen in the emergency department secondary to pain on several occasions. He describes his fingers as turning a bluish color. He has undergone a workup consisting of x-rays and blood work which have been negative. He had an ultrasound which revealed diminished blood flow to his digits and monophasic waveforms in his ulnar artery. He has been admitted for further workup. The patient has been healthy up until this event. He works as a Geneticist, molecular. He denies a history of smoking. He denies any particular traumatic event. He does report generalized arthralgias for the past month. He has not had any fevers or chills. He has recently had episodes of nausea and vomiting. He does not take any medicine other than recent pain medicine. He has used cocaine and marijuana recently. He denies any IV drug use.  History reviewed. No pertinent past medical history.  Past Surgical History  Procedure Date  . Elbow surgery   . Tonsillectomy     History   Social History  . Marital Status: Single    Spouse Name: N/A    Number of Children: N/A  . Years of Education: N/A   Occupational History  . Not on file.   Social History Main Topics  . Smoking status: Never Smoker   . Smokeless tobacco: Not on file  . Alcohol Use: Yes  . Drug Use: 5 per week    Special: Cocaine  . Sexually Active:    Other Topics Concern  . Not on file   Social History Narrative  . No narrative on file    No family history on file.  Allergies as of 01/13/2012  . (No Known Allergies)    No current facility-administered  medications on file prior to encounter.   Current Outpatient Prescriptions on File Prior to Encounter  Medication Sig Dispense Refill  . Aspirin-Salicylamide-Caffeine (BC HEADACHE POWDER PO) Take 1 Package by mouth daily as needed. For headache      . ibuprofen (ADVIL,MOTRIN) 800 MG tablet Take 800 mg by mouth every 8 (eight) hours as needed. For pain      . oxyCODONE-acetaminophen (PERCOCET) 10-325 MG per tablet Take 1 tablet by mouth every 4 (four) hours as needed for pain.  10 tablet  0     REVIEW OF SYSTEMS: Cardiovascular: No chest pain, chest pressure, palpitations, orthopnea, or dyspnea on exertion. No claudication or rest pain,  No history of DVT or phlebitis. Pulmonary: No productive cough, asthma or wheezing. Neurologic: No weakness, paresthesias, aphasia, or amaurosis. No dizziness. Hematologic: No bleeding problems or clotting disorders. Musculoskeletal: Generalized arthralgias for the past Gastrointestinal: No blood in stool or hematemesis Genitourinary: No dysuria or hematuria. Psychiatric:: No history of major depression. Integumentary: No rashes or ulcers. Constitutional: No fever or chills.  PHYSICAL EXAMINATION: General: The patient appears their stated age.  Vital signs are BP 163/95  Pulse 90  Temp 98.3 F (36.8 C) (Oral)  Resp 16  Ht 5\' 5"  (1.651 m)  Wt 129 lb 6.6 oz (58.7 kg)  BMI 21.53 kg/m2  SpO2 90% Pulmonary: Respirations are non-labored HEENT:  No gross abnormalities Abdomen:  Soft and non-tender  Musculoskeletal: There are no major deformities.   Neurologic: No focal weakness or paresthesias are detected, Skin: Early ulceration on both index fingers.Marland Kitchen Psychiatric: The patient has normal affect. Cardiovascular: There is a regular rate and rhythm without significant murmur appreciated. Palpable radial and ulnar arteries   Assessment:  Early bilateral digital ischemia Plan: I agree with admission for IV heparinization. The patient needs to undergo  bilateral upper extremity angiography to evaluate the blood flow to his hand and digits. I will have one of my partners perform this on Monday. This will be done to exclude any proximal etiology. He also needs to undergo a full M. LOC workup, including echocardiography. With his history of arthralgias, there is certainly a possibility that this could be a vasculitis or an autoimmune process. C-reactive protein and sedimentation rate are ordered. I discussed the risks and benefits of angiography as well as the details of the procedure. This will be scheduled for tomorrow. He'll be n.p.o. after midnight. Once the arteriogram has been performed he will likely benefit from consultation with a hand surgeon. I would consider topical nitroglycerin and potentially a calcium channel blocker, although the benefit for these medications is marginal. I would also place him on aspirin which I'll order today.     Jorge Ny, M.D. [facility] [place] Office: [phone] Pager:  [phone]

## 2012-01-14 NOTE — Progress Notes (Addendum)
[facility] PROGRESS NOTE  Assessment/Plan: Patient Active Hospital Problem List: Ischemic finger (01/13/2012) -patient currently on heparin. In the differential diagnoses would be thromboangiitis obliterans, but he doesn't smoke. Vascular as was consulted and will go for angiogram. I am especially concerned about endocarditis and inflammatory disease; especially subacute endocarditis,cultures have been done he denies any history of IV drug abuse, he does have poor oral hygiene with multiple cavities in his teeth, small splinter hemorrhage in his nailbeds and oral mucosa. He does have a systolic murmur in the tricuspid area. We'll get a 2-D echo. He doesn't have a leukocytosis or fevers. reorder blood cultures. Also we'll check him for HIV, RPR for syphilis. Lyme serology was ordered there is no bull's-eye rash. Also check ANA , RF, ESR and CRP have been  complaining of joint pain especially in the mornings which improves as the day goes by. Check Bilateral hand x-ray. Rheumatoid arthritis, SLE and remitting seronegative symmetrical synovitis are aldo high in the differential.   Code Status: Full Family Communication: Patient  Disposition Plan: To be determined  Consultants:  Vascular surgery  Procedures:  Angiogram 01/15/2012  Antibiotics:  None (indicate start date, and stop date if known)  HPI/Subjective: Patient is complaining of pain throughout his body mainly in his joints. He relates no fever or chills but relates that he likes the room cold.  Objective: Filed Vitals:   01/13/12 2332 01/14/12 0110 01/14/12 0153 01/14/12 0625  BP: 114/99   163/95  Pulse: 112   90  Temp: 102.1 F (38.9 C) 102.7 F (39.3 C) 100.8 F (38.2 C) 98.3 F (36.8 C)  TempSrc: Oral Oral Oral Oral  Resp: 18   16  Height:      Weight:      SpO2: 100%   90%    Intake/Output Summary (Last 24 hours) at 01/14/12 1124 Last data filed at 01/14/12 0628  Gross per 24 hour  Intake 1007.58 ml    Output   1350 ml  Net -342.42 ml   Filed Weights   01/13/12 1814 01/13/12 2325  Weight: 63.504 kg (140 lb) 58.7 kg (129 lb 6.6 oz)    Exam:  General: Alert, awake, oriented x3, in no acute distress.  HEENT: No bruits, no goiter. He has cavities in his teeth, he has a splinter hemorrhage on his left inner cheek with eversion of the lids I do not appreciate any other Heart: Regular rate and rhythm, systolic murmur is appreciated in the tricuspid area no rubs, gallops.  Lungs: Good air movement, clear to auscultation Abdomen: Soft, nontender, nondistended, positive bowel sounds.  Neuro: Grossly intact, nonfocal. Extremities: 1 hands bilaterally with purplish fingertips bilaterally in the index fingers and on the right on the middle finger. Exquisitely tender to touch cannot make a fist. Splinter hemorrhage in his nailbed    Data Reviewed: Basic Metabolic Panel:  Lab 01/14/12 1610 01/13/12 2257 01/09/12 1556  NA 133* 135 136  K 4.1 3.9 4.2  CL 101 100 100  CO2 23 25 29   GLUCOSE 127* 104* 97  BUN 10 10 8   CREATININE 0.96 0.89 0.84  CALCIUM 8.6 8.8 9.3  MG 2.0 -- --  PHOS -- -- --   Liver Function Tests:  Lab 01/14/12 0245 01/09/12 1556  AST 23 31  ALT 16 26  ALKPHOS 80 88  BILITOT 0.2* 0.3  PROT 8.3 9.1*  ALBUMIN 2.7* 3.1*   No results found for this basename: LIPASE:5,AMYLASE:5 in the last 168 hours No  results found for this basename: AMMONIA:5 in the last 168 hours CBC:  Lab 01/14/12 0245 01/13/12 1629 01/09/12 1556  WBC 6.1 5.9 6.7  NEUTROABS -- -- 4.6  HGB 12.0* 12.7* 13.5  HCT 36.6* 39.4 40.3  MCV 88.2 89.1 88.6  PLT 467* 495* 452*   Cardiac Enzymes: No results found for this basename: CKTOTAL:5,CKMB:5,CKMBINDEX:5,TROPONINI:5 in the last 168 hours BNP (last 3 results) No results found for this basename: PROBNP:3 in the last 8760 hours CBG: No results found for this basename: GLUCAP:5 in the last 168 hours  No results found for this or any previous  visit (from the past 240 hour(s)).   Studies: Dg Chest 2 View  01/14/2012  *RADIOLOGY REPORT*  Clinical Data: Fever  CHEST - 2 VIEW  Comparison: 06/06/2010  Findings: Lungs are essentially clear. No pleural effusion or pneumothorax.  Cardiomediastinal silhouette is within normal limits.  Mild degenerative changes of the visualized thoracolumbar spine.  IMPRESSION: No evidence of acute cardiopulmonary disease.   Original Report Authenticated By: Charline Bills, M.D.     Scheduled Meds:   . aspirin  81 mg Oral Daily  . heparin  2,000 Units Intravenous Once  . heparin  2,000 Units Intravenous Once  . heparin  3,000 Units Intravenous Once  . HYDROcodone-acetaminophen  2 tablet Oral Once  . ketorolac  15 mg Intravenous Once  . sodium chloride  3 mL Intravenous Q12H  . DISCONTD: ibuprofen  600 mg Oral NOW   Continuous Infusions:   . sodium chloride 125 mL (01/14/12 0743)  . heparin    . DISCONTD: heparin 1,150 Units/hr (01/14/12 0358)     Marinda Elk  [facility] Pager [phone].  If 8PM-8AM, please contact night-coverage at [email or web address], password [facility] 01/14/2012, 11:24 AM  LOS: 1 day

## 2012-01-14 NOTE — Progress Notes (Signed)
ANTICOAGULATION CONSULT NOTE - Follow Up Consult  Pharmacy Consult for heparin Indication: finger ischemia  Labs:  Basename 01/14/12 1624 01/14/12 0908 01/14/12 0245 01/13/12 2257 01/13/12 2024 01/13/12 1629  HGB -- -- 12.0* -- -- 12.7*  HCT -- -- 36.6* -- -- 39.4  PLT -- -- 467* -- -- 495*  APTT -- -- -- -- 55* --  LABPROT -- -- 14.8 -- 14.5 --  INR -- -- 1.18 -- 1.15 --  HEPARINUNFRC 0.13* 0.10* <0.10* -- -- --  CREATININE -- -- 0.96 0.89 -- --  CKTOTAL -- -- -- -- -- --  CKMB -- -- -- -- -- --  TROPONINI -- -- -- -- -- --    Assessment: 51 yo male to continue on heparin for finger ischemia. Heparin level 0.13 on 1350 units/hr is subtherapeutic despite rate increase and bolus.   Goal of Therapy:  Heparin level 0.3-0.7 units/ml Monitor platelets by anticoagulation protocol: Yes  Plan:  -Will rebolus with heparin 2000 units x1  -Increase gtt to 1550 units/hr (=15.5 ml/hr) -Check heparin level in 6hr at 1700 -Daily heparin level and CBC  Harland German, Pharm D 01/14/2012 5:16 PM

## 2012-01-14 NOTE — Progress Notes (Signed)
ANTICOAGULATION CONSULT NOTE - Follow Up Consult  Pharmacy Consult for heparin Indication: finger ischemia  Labs:  Basename 01/14/12 0245 01/13/12 2257 01/13/12 2024 01/13/12 1629  HGB 12.0* -- -- 12.7*  HCT 36.6* -- -- 39.4  PLT 467* -- -- 495*  APTT -- -- 55* --  LABPROT 14.8 -- 14.5 --  INR 1.18 -- 1.15 --  HEPARINUNFRC <0.10* -- -- --  CREATININE 0.96 0.89 -- --  CKTOTAL -- -- -- --  CKMB -- -- -- --  TROPONINI -- -- -- --    Assessment: 51yo male undetectable on heparin with initial dosing for finger ischemia.  Goal of Therapy:  Heparin level 0.3-0.7 units/ml   Plan:  Will rebolus with heparin 2000 units x1 and increase gtt by 4 units/kg/hr to 1150 units/hr and check level in 6hr.  Colleen Can PharmD BCPS 01/14/2012,3:47 AM

## 2012-01-14 NOTE — Progress Notes (Signed)
ANTIBIOTIC CONSULT NOTE - INITIAL  Pharmacy Consult for Vancomycin Indication: Rule out endocarditis  No Known Allergies  Patient Measurements: Height: 5\' 5"  (165.1 cm) Weight: 129 lb 6.6 oz (58.7 kg) IBW/kg (Calculated) : 61.5   Vital Signs: Temp: 98.3 F (36.8 C) (10/13 0625) Temp src: Oral (10/13 0625) BP: 163/95 mmHg (10/13 0625) Pulse Rate: 90  (10/13 0625) Intake/Output from previous day: 10/12 0701 - 10/13 0700 In: 1007.6 [P.O.:240; I.V.:767.6] Out: 1350 [Urine:1350] Intake/Output from this shift:    Labs:  Basename 01/14/12 0245 01/13/12 2257 01/13/12 1629  WBC 6.1 -- 5.9  HGB 12.0* -- 12.7*  PLT 467* -- 495*  LABCREA -- -- --  CREATININE 0.96 0.89 --   Estimated Creatinine Clearance: 74.7 ml/min (by C-G formula based on Cr of 0.96). No results found for this basename: VANCOTROUGH:2,VANCOPEAK:2,VANCORANDOM:2,GENTTROUGH:2,GENTPEAK:2,GENTRANDOM:2,TOBRATROUGH:2,TOBRAPEAK:2,TOBRARND:2,AMIKACINPEAK:2,AMIKACINTROU:2,AMIKACIN:2, in the last 72 hours   Microbiology: No results found for this or any previous visit (from the past 720 hour(s)).  Medical History: History reviewed. No pertinent past medical history.  Medications:  Scheduled:    . aspirin  81 mg Oral Daily  . heparin  2,000 Units Intravenous Once  . heparin  2,000 Units Intravenous Once  . heparin  3,000 Units Intravenous Once  . HYDROcodone-acetaminophen  2 tablet Oral Once  . ketorolac  15 mg Intravenous Once  . sodium chloride  3 mL Intravenous Q12H  . DISCONTD: ibuprofen  600 mg Oral NOW   Assessment: 51 yo male known to pharmacy for heparin dosing now to start on vancomycin for possible endocarditis. Pt is currently afebrile but with Tmax 102.1 overnight, WBC wnl, blood cultures sent. Wt 58.7 kg, CrCl ~75 ml/min (of note Scr trending up from yesterday).   Goal of Therapy:  Vancomycin trough level 15-20 mcg/ml  Plan:  1. Vancomycin 750mg  IV q12h 2. Monitor renal function, cultures, fever  curve, vancomycin trough at steady state if clinically indicated.  Crist Fat L 01/14/2012,12:12 PM

## 2012-01-14 NOTE — Progress Notes (Signed)
  Echocardiogram 2D Echocardiogram has been performed.  Orvel Cutsforth 01/14/2012, 3:26 PM 

## 2012-01-14 NOTE — Progress Notes (Signed)
Sean [name] [id number] Admitted to 5526: 01/14/2012 2:56 AM Attending Provider: Gery Pray, MD    Sean [name] is a 51 y.o. male patient admitted from ED awake, alert  & orientated  X 3,  Full Code, no c/o shortness of breath, no c/o chest pain, no distress noted. Tele # [id number] placed and pt is currently running: ST   IV site WDL:  with a transparent dsg that's clean dry and intact.  Allergies:  No Known Allergies    History:  obtained from patient  Pt orientation to unit, room and routine. Information packet given to patient and safety video watched.  Admission INP armband ID verified with patient, and in place. SR up x 2, fall risk assessment complete with Patient verbalizing understanding of risks associated with falls. Pt verbalizes an understanding of how to use the call bell and to call for help before getting out of bed.  Skin, clean-dry- intact without evidence of bruising, skin tears.  Patient has purplish discoloration of fingertips bilaterally.  Patient admits to cocaine abuse over the past twenty years ranging from 3-7 times per week.   Will cont to monitor and assist as needed.  Elisha Ponder, RN 01/14/2012 2:56 AM

## 2012-01-14 NOTE — Progress Notes (Signed)
VASCULAR LAB PRELIMINARY  PRELIMINARY  PRELIMINARY  PRELIMINARY  Upper extremity arterial Dopplers and duplex completed.    Preliminary report:  Arterial flow appears adequate throughout the bilateral upper extremities, but waveforms in the radial and ulnar arteries appear abnormal.  The right index finger pressure is abnormal and the left index finger shows a flat signal, indicating no flow to digit.  Sean [name], 01/14/2012, 8:50 AM

## 2012-01-15 ENCOUNTER — Encounter (HOSPITAL_COMMUNITY)
Admission: EM | Disposition: A | Discharge status: Home / Self Care | Payer: Self-pay | Source: Home / Self Care | Attending: Internal Medicine

## 2012-01-15 DIAGNOSIS — I70209 Unspecified atherosclerosis of native arteries of extremities, unspecified extremity: Secondary | ICD-10-CM

## 2012-01-15 HISTORY — PX: BILATERAL UPPER EXTREMITY ANGIOGRAM: SHX5503

## 2012-01-15 LAB — CBC
HCT: 39.1 % (ref 39.0–52.0)
Hemoglobin: 12 g/dL — ABNORMAL LOW (ref 13.0–17.0)
MCH: 28.8 pg (ref 26.0–34.0)
MCV: 89.2 fL (ref 78.0–100.0)
MCV: 88.9 fL (ref 78.0–100.0)
Platelets: 499 10*3/uL — ABNORMAL HIGH (ref 150–400)
RBC: 4.17 MIL/uL — ABNORMAL LOW (ref 4.22–5.81)
RBC: 4.4 MIL/uL (ref 4.22–5.81)
WBC: 7.1 10*3/uL (ref 4.0–10.5)
WBC: 5.7 10*3/uL (ref 4.0–10.5)

## 2012-01-15 LAB — HEPARIN LEVEL (UNFRACTIONATED): Heparin Unfractionated: 0.23 IU/mL — ABNORMAL LOW (ref 0.30–0.70)

## 2012-01-15 LAB — CREATININE, SERUM
GFR calc Af Amer: >90 mL/min (ref >90)
GFR calc non Af Amer: >90 mL/min (ref >90)

## 2012-01-15 LAB — URINALYSIS, ROUTINE W REFLEX MICROSCOPIC
Glucose, UA: NEGATIVE mg/dL
Hgb urine dipstick: NEGATIVE
Specific Gravity, Urine: 1.042 — ABNORMAL HIGH (ref 1.005–1.030)
pH: 6 (ref 5.0–8.0)

## 2012-01-15 LAB — ANTI-NUCLEAR AB-TITER (ANA TITER): ANA Titer 1: 1:320 {titer} — ABNORMAL HIGH

## 2012-01-15 LAB — ANA: Anti Nuclear Antibody(ANA): POSITIVE — AB

## 2012-01-15 SURGERY — BILATERAL UPPER EXTREMITY ANGIOGRAM
Anesthesia: LOCAL

## 2012-01-15 MED ORDER — ENOXAPARIN SODIUM 40 MG/0.4ML ~~LOC~~ SOLN
40.0000 mg | SUBCUTANEOUS | Status: DC
  Administered 2012-01-16 – 2012-01-19 (×4): 40 mg via SUBCUTANEOUS
  Filled 2012-01-15 (×6): qty 0.4

## 2012-01-15 MED ORDER — SODIUM CHLORIDE 0.9 % IV SOLN
1.0000 mL/kg/h | INTRAVENOUS | Status: DC
  Administered 2012-01-15: 1 mL/kg/h via INTRAVENOUS

## 2012-01-15 MED ORDER — FENTANYL CITRATE 0.05 MG/ML IJ SOLN
INTRAMUSCULAR | Status: AC
  Filled 2012-01-15: qty 2

## 2012-01-15 MED ORDER — MIDAZOLAM HCL 2 MG/2ML IJ SOLN
INTRAMUSCULAR | Status: AC
  Filled 2012-01-15: qty 2

## 2012-01-15 MED ORDER — ONDANSETRON HCL 4 MG/2ML IJ SOLN: 4.0000 mg | Freq: Four times a day (QID) | INTRAMUSCULAR | Status: DC | PRN

## 2012-01-15 MED ORDER — ACETAMINOPHEN 325 MG PO TABS: 650.0000 mg | ORAL_TABLET | ORAL | Status: DC | PRN

## 2012-01-15 MED ORDER — LIDOCAINE HCL (PF) 1 % IJ SOLN
INTRAMUSCULAR | Status: AC
  Filled 2012-01-15: qty 30

## 2012-01-15 NOTE — Progress Notes (Signed)
Patient ID: Sean [name], male   DOB: 14-Oct-1960, 51 y.o.   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 In the differential diagnoses would be thromboangiitis obliterans, vasculitis, endocarditis.  Rheumatoid arthritis, SLE and remitting seronegative symmetrical synovitis are aldo high in the differential. Vascular has was consulted and will go forward with an angiogram. I am especially concerned about endocarditis and inflammatory disease; especially subacute endocarditis,cultures have been done he denies any history of IV drug abuse, he does have poor oral hygiene with multiple cavities in his teeth, small splinter hemorrhage in his nailbeds and oral mucosa. He does have a systolic murmur in the tricuspid area. 2D echo is within normal limits, no mention of vegetation.   He doesn't have a leukocytosis or fevers. reorder blood cultures. Also we'll check him for HIV, RPR for syphilis. His ANA is positive.  We will check an ANCA and a PANCA. Lyme serology was ordered there is no bull's-eye rash. Also check RF, ESR and CRP have been  complaining of joint pain especially in the mornings which improves as the day goes by. Bilateral hand x-ray is negative for anything acute.    Arteriogram shows bilateral digital ischemia.  I will consult the hand surgeon on call, Dr. Mina Marble.  Will likely need both Hand surgeon and Rheumatological  consultation pending the results of the Angiogram.  Substance Abuse Urine Drug screen is positive for cocaine and benzodiazepines.  Will consult social work for counseling.   Code Status: Full Family Communication: Patient  Disposition Plan: To be determined  Consultants:  Vascular surgery  Procedures:  Angiogram 01/15/2012  Antibiotics:  None (indicate start date, and stop date if known)  HPI/Subjective: Patient mentions new  swelling in his feet bilaterally and his right ankle.  Objective: Filed Vitals:   01/14/12 0625 01/14/12 1600 01/14/12 2042 01/15/12 0447  BP: 163/95 149/94 154/91 157/99  Pulse: 90 101 92 105  Temp: 98.3 F (36.8 C) 98.1 F (36.7 C) 98.5 F (36.9 C) 99.1 F (37.3 C)  TempSrc: Oral Oral Oral Oral  Resp: 16 18 18 18   Height:      Weight:      SpO2: 90% 99% 99% 95%    Intake/Output Summary (Last 24 hours) at 01/15/12 1333 Last data filed at 01/15/12 0647  Gross per 24 hour  Intake 3097.91 ml  Output   2200 ml  Net 897.91 ml   Filed Weights   01/13/12 1814 01/13/12 2325  Weight: 63.504 kg (140 lb) 58.7 kg (129 lb 6.6 oz)    Exam:  General: Alert, awake, oriented x3, in no acute distress.  HEENT: No bruits, no goiter. He has cavities in his teeth, he has a splinter hemorrhage on his left inner cheek.  Temples appear sunken. Heart: Regular rate and rhythm, systolic murmur is appreciated in the tricuspid area no rubs, gallops.  Lungs: Good air movement, clear to auscultation.  No wheeze, crackles, or rals Abdomen: Soft, nontender, nondistended, positive bowel sounds.  Neuro: Grossly intact, nonfocal. Extremities: Upper ext.  Fingers and hands are swollen bilterally.  In his RUE the swelling extends above his wrist.  1st and 2nd digits on each hand demonstrate significant splinter hemorrhaging. Lower ext.  Toes also appear somewhat swollen.  Right dorsum of the foot and ankle with 1+ swelling.  No erythema noted.    Data Reviewed: Basic Metabolic Panel:  Lab 01/14/12 7829 01/13/12 2257 01/09/12  1556  NA 133* 135 136  K 4.1 3.9 4.2  CL 101 100 100  CO2 23 25 29   GLUCOSE 127* 104* 97  BUN 10 10 8   CREATININE 0.96 0.89 0.84  CALCIUM 8.6 8.8 9.3  MG 2.0 -- --  PHOS -- -- --   Liver Function Tests:  Lab 01/14/12 0245 01/09/12 1556  AST 23 31  ALT 16 26  ALKPHOS 80 88  BILITOT 0.2* 0.3  PROT 8.3 9.1*  ALBUMIN 2.7* 3.1*   CBC:  Lab 01/15/12 0500 01/14/12 0245  01/13/12 1629 01/09/12 1556  WBC 7.1 6.1 5.9 6.7  NEUTROABS -- -- -- 4.6  HGB 12.0* 12.0* 12.7* 13.5  HCT 37.2* 36.6* 39.4 40.3  MCV 89.2 88.2 89.1 88.6  PLT 499* 467* 495* 452*     Recent Results (from the past 240 hour(s))  CULTURE, BLOOD (ROUTINE X 2)     Status: Normal (Preliminary result)   Collection Time   01/14/12  1:50 AM      Component Value Range Status Comment   Specimen Description BLOOD RIGHT ARM   Final    Special Requests BOTTLES DRAWN AEROBIC AND ANAEROBIC 10CC EA   Final    Culture  Setup Time 01/14/2012 14:18   Final    Culture     Final    Value:        BLOOD CULTURE RECEIVED NO GROWTH TO DATE CULTURE WILL BE HELD FOR 5 DAYS BEFORE ISSUING A FINAL NEGATIVE REPORT   Report Status PENDING   Incomplete   CULTURE, BLOOD (ROUTINE X 2)     Status: Normal (Preliminary result)   Collection Time   01/14/12  1:55 AM      Component Value Range Status Comment   Specimen Description BLOOD RIGHT HAND   Final    Special Requests BOTTLES DRAWN AEROBIC AND ANAEROBIC 10CC EA   Final    Culture  Setup Time 01/14/2012 14:19   Final    Culture     Final    Value:        BLOOD CULTURE RECEIVED NO GROWTH TO DATE CULTURE WILL BE HELD FOR 5 DAYS BEFORE ISSUING A FINAL NEGATIVE REPORT   Report Status PENDING   Incomplete   CULTURE, BLOOD (ROUTINE X 2)     Status: Normal (Preliminary result)   Collection Time   01/14/12 12:48 PM      Component Value Range Status Comment   Specimen Description BLOOD RIGHT HAND   Final    Special Requests BOTTLES DRAWN AEROBIC ONLY 10CC   Final    Culture  Setup Time 01/14/2012 21:35   Final    Culture     Final    Value:        BLOOD CULTURE RECEIVED NO GROWTH TO DATE CULTURE WILL BE HELD FOR 5 DAYS BEFORE ISSUING A FINAL NEGATIVE REPORT   Report Status PENDING   Incomplete   CULTURE, BLOOD (ROUTINE X 2)     Status: Normal (Preliminary result)   Collection Time   01/14/12 12:51 PM      Component Value Range Status Comment   Specimen Description  BLOOD RIGHT ARM   Final    Special Requests BOTTLES DRAWN AEROBIC AND ANAEROBIC 10CC   Final    Culture  Setup Time 01/14/2012 21:35   Final    Culture     Final    Value:        BLOOD CULTURE RECEIVED NO GROWTH TO  DATE CULTURE WILL BE HELD FOR 5 DAYS BEFORE ISSUING A FINAL NEGATIVE REPORT   Report Status PENDING   Incomplete      Studies:  Arteriogram 01/15/12  FINDING(S):  Widely patent Type II aortic arch  Widely patent innominate , left common carotid , and left subclavian arteries  Patent bilateral vertebral arteries  Patent right subclavian, common carotid, axillary, brachial, radial and ulnar arteries  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 No pleural effusion or pneumothorax.  Cardiomediastinal silhouette is within normal limits.  Mild degenerative changes of the visualized thoracolumbar spine.  IMPRESSION: No evidence of acute cardiopulmonary disease.   Original Report Authenticated By: Charline Bills, M.D.    Dg Hand Complete Left  01/14/2012  *RADIOLOGY REPORT*  Clinical Data: Pain and swelling in the hands bilaterally.  LEFT HAND - COMPLETE 3+ VIEW  Comparison: No priors.  Findings: Three views of the left hand demonstrate no acute fracture, subluxation or dislocation.  There is mild joint space narrowing, subchondral sclerosis, subchondral cyst formation and osteophyte formation in the DIP joints and first MCP joint, compatible with mild osteoarthritis.  IMPRESSION: 1.  No acute radiographic abnormality of the left hand. 2.  Changes of mild osteoarthritis, as above.   Original Report Authenticated By:  Florencia Reasons, M.D.    Dg Hand Complete Right  01/14/2012  *RADIOLOGY REPORT*  Clinical Data: Pain and swelling in the hands bilaterally.  RIGHT HAND - COMPLETE 3+ VIEW  Comparison: No priors.  Findings: The three views of the right hand demonstrate no definite acute displaced fracture, subluxation or dislocation.  There is a well corticated fragment adjacent to the ulnar styloid, compatible with an old ulnar styloid avulsion fracture.  Multifocal mild joint space narrowing, subchondral sclerosis, subchondral cyst formation and osteophyte formation is noted, most pronounced in the DIP joints and the first MCP joint, compatible with mild osteoarthritis.  IMPRESSION: 1.  No acute radiographic abnormality of the right hand. 2.  Mild osteoarthritis, as above. 3.  Old ulnar styloid avulsion fracture, incidentally noted.   Original Report Authenticated By: Florencia Reasons, M.D.     Scheduled Meds:    . aspirin  81 mg Oral Daily  . fentaNYL      . heparin  2,000 Units Intravenous Once  . lidocaine      . midazolam      . sodium chloride  3 mL Intravenous Q12H  . vancomycin  750 mg Intravenous Q12H   Continuous Infusions:    . sodium chloride 20 mL/hr at 01/15/12 1117  . heparin 1,700 Units/hr (01/15/12 0559)  . DISCONTD: heparin 1,350 Units/hr (01/14/12 1143)     Stephani Police  [facility] Pager [phone].  If 7PM-7AM, please contact night-coverage at [email or web address], password [facility] 01/15/2012, 1:33 PM  LOS: 2 days

## 2012-01-15 NOTE — Consult Note (Signed)
Reason for Consult:bilateral index finger ischemia Referring Physician: [name] is an 51 y.o. male.  HPI: as above with several day h/o worsening bilateral index finger pain and distal color changes volarly with no h/o trauma etc  History reviewed. No pertinent past medical history.  Past Surgical History  Procedure Date  . Elbow surgery   . Tonsillectomy     No family history on file.  Social History:  reports that he has never smoked. He does not have any smokeless tobacco history on file. He reports that he drinks alcohol. He reports that he uses illicit drugs (Cocaine) about 5 times per week.  Allergies: No Known Allergies  Medications:  Scheduled:   . aspirin  81 mg Oral Daily  . enoxaparin  40 mg Subcutaneous Q24H  . fentaNYL      . heparin  2,000 Units Intravenous Once  . lidocaine      . midazolam      . sodium chloride  3 mL Intravenous Q12H  . DISCONTD: vancomycin  750 mg Intravenous Q12H    Results for orders placed during the hospital encounter of 01/13/12 (from the past 48 hour(s))  URINE RAPID DRUG SCREEN (HOSP PERFORMED)     Status: Abnormal   Collection Time   01/13/12  7:33 PM      Component Value Range Comment   Opiates NONE DETECTED  NONE DETECTED    Cocaine POSITIVE (*) NONE DETECTED    Benzodiazepines POSITIVE (*) NONE DETECTED    Amphetamines NONE DETECTED  NONE DETECTED    Tetrahydrocannabinol NONE DETECTED  NONE DETECTED    Barbiturates NONE DETECTED  NONE DETECTED   APTT     Status: Abnormal   Collection Time   01/13/12  8:24 PM      Component Value Range Comment   aPTT 55 (*) 24 - 37 seconds   PROTIME-INR     Status: Normal   Collection Time   01/13/12  8:24 PM      Component Value Range Comment   Prothrombin Time 14.5  11.6 - 15.2 seconds    INR 1.15  0.00 - 1.49   BASIC METABOLIC PANEL     Status: Abnormal   Collection Time   01/13/12 10:57 PM      Component Value Range Comment   Sodium 135  135 - 145 mEq/L    Potassium 3.9  3.5 - 5.1 mEq/L    Chloride 100  96 - 112 mEq/L    CO2 25  19 - 32 mEq/L    Glucose, Bld 104 (*) 70 - 99 mg/dL    BUN 10  6 - 23 mg/dL    Creatinine, Ser 1.91  0.50 - 1.35 mg/dL    Calcium 8.8  8.4 - 47.8 mg/dL    GFR calc non Af Amer >90  >90 mL/min    GFR calc Af Amer >90  >90 mL/min   CULTURE, BLOOD (ROUTINE X 2)     Status: Normal (Preliminary result)   Collection Time   01/14/12  1:50 AM      Component Value Range Comment   Specimen Description BLOOD RIGHT ARM      Special Requests BOTTLES DRAWN AEROBIC AND ANAEROBIC 10CC EA      Culture  Setup Time 01/14/2012 14:18      Culture        Value:        BLOOD CULTURE RECEIVED NO GROWTH TO DATE CULTURE WILL BE  HELD FOR 5 DAYS BEFORE ISSUING [name] FINAL NEGATIVE REPORT   Report Status PENDING     CULTURE, BLOOD (ROUTINE X 2)     Status: Normal (Preliminary result)   Collection Time   01/14/12  1:55 AM      Component Value Range Comment   Specimen Description BLOOD RIGHT HAND      Special Requests BOTTLES DRAWN AEROBIC AND ANAEROBIC 10CC EA      Culture  Setup Time 01/14/2012 14:19      Culture        Value:        BLOOD CULTURE RECEIVED NO GROWTH TO DATE CULTURE WILL BE HELD FOR 5 DAYS BEFORE ISSUING [name] FINAL NEGATIVE REPORT   Report Status PENDING     URINALYSIS, ROUTINE W REFLEX MICROSCOPIC     Status: Normal   Collection Time   01/14/12  2:32 AM      Component Value Range Comment   Color, Urine YELLOW  YELLOW    APPearance CLEAR  CLEAR    Specific Gravity, Urine 1.009  1.005 - 1.030    pH 5.0  5.0 - 8.0    Glucose, UA NEGATIVE  NEGATIVE mg/dL    Hgb urine dipstick NEGATIVE  NEGATIVE    Bilirubin Urine NEGATIVE  NEGATIVE    Ketones, ur NEGATIVE  NEGATIVE mg/dL    Protein, ur NEGATIVE  NEGATIVE mg/dL    Urobilinogen, UA 0.2  0.0 - 1.0 mg/dL    Nitrite NEGATIVE  NEGATIVE    Leukocytes, UA NEGATIVE  NEGATIVE MICROSCOPIC NOT DONE ON URINES WITH NEGATIVE PROTEIN, BLOOD, LEUKOCYTES, NITRITE, OR GLUCOSE <1000 mg/dL.    HEPARIN LEVEL (UNFRACTIONATED)     Status: Abnormal   Collection Time   01/14/12  2:45 AM      Component Value Range Comment   Heparin Unfractionated <0.10 (*) 0.30 - 0.70 IU/mL   COMPREHENSIVE METABOLIC PANEL     Status: Abnormal   Collection Time   01/14/12  2:45 AM      Component Value Range Comment   Sodium 133 (*) 135 - 145 mEq/L    Potassium 4.1  3.5 - 5.1 mEq/L    Chloride 101  96 - 112 mEq/L    CO2 23  19 - 32 mEq/L    Glucose, Bld 127 (*) 70 - 99 mg/dL    BUN 10  6 - 23 mg/dL    Creatinine, Ser 1.61  0.50 - 1.35 mg/dL    Calcium 8.6  8.4 - 09.6 mg/dL    Total Protein 8.3  6.0 - 8.3 g/dL    Albumin 2.7 (*) 3.5 - 5.2 g/dL    AST 23  0 - 37 U/L    ALT 16  0 - 53 U/L    Alkaline Phosphatase 80  39 - 117 U/L    Total Bilirubin 0.2 (*) 0.3 - 1.2 mg/dL    GFR calc non Af Amer >90  >90 mL/min    GFR calc Af Amer >90  >90 mL/min   CBC     Status: Abnormal   Collection Time   01/14/12  2:45 AM      Component Value Range Comment   WBC 6.1  4.0 - 10.5 K/uL    RBC 4.15 (*) 4.22 - 5.81 MIL/uL    Hemoglobin 12.0 (*) 13.0 - 17.0 g/dL    HCT 04.5 (*) 40.9 - 52.0 %    MCV 88.2  78.0 - 100.0 fL  MCH 28.9  26.0 - 34.0 pg    MCHC 32.8  30.0 - 36.0 g/dL    RDW 40.9  81.1 - 91.4 %    Platelets 467 (*) 150 - 400 K/uL   PROTIME-INR     Status: Normal   Collection Time   01/14/12  2:45 AM      Component Value Range Comment   Prothrombin Time 14.8  11.6 - 15.2 seconds    INR 1.18  0.00 - 1.49   MAGNESIUM     Status: Normal   Collection Time   01/14/12  2:45 AM      Component Value Range Comment   Magnesium 2.0  1.5 - 2.5 mg/dL   B. BURGDORFI ANTIBODIES     Status: Normal   Collection Time   01/14/12  2:45 AM      Component Value Range Comment   B burgdorferi Ab IgG+IgM 0.59     HEPARIN LEVEL (UNFRACTIONATED)     Status: Abnormal   Collection Time   01/14/12  9:08 AM      Component Value Range Comment   Heparin Unfractionated 0.10 (*) 0.30 - 0.70 IU/mL   ANA     Status:  Abnormal   Collection Time   01/14/12 12:31 PM      Component Value Range Comment   ANA POSITIVE (*) NEGATIVE   PATHOLOGIST SMEAR REVIEW     Status: Normal   Collection Time   01/14/12 12:31 PM      Component Value Range Comment   Path Review            HIV ANTIBODY (ROUTINE TESTING)     Status: Normal   Collection Time   01/14/12 12:31 PM      Component Value Range Comment   HIV NON REACTIVE  NON REACTIVE   RPR     Status: Normal   Collection Time   01/14/12 12:31 PM      Component Value Range Comment   RPR NON REACTIVE  NON REACTIVE   ANTI-NUCLEAR AB-TITER (ANA TITER)     Status: Abnormal   Collection Time   01/14/12 12:31 PM      Component Value Range Comment   ANA Titer 1 1:320 (*) <1:40    ANA Pattern 1 HOMOGENOUS     CULTURE, BLOOD (ROUTINE X 2)     Status: Normal (Preliminary result)   Collection Time   01/14/12 12:48 PM      Component Value Range Comment   Specimen Description BLOOD RIGHT HAND      Special Requests BOTTLES DRAWN AEROBIC ONLY 10CC      Culture  Setup Time 01/14/2012 21:35      Culture        Value:        BLOOD CULTURE RECEIVED NO GROWTH TO DATE CULTURE WILL BE HELD FOR 5 DAYS BEFORE ISSUING [name] FINAL NEGATIVE REPORT   Report Status PENDING     CULTURE, BLOOD (ROUTINE X 2)     Status: Normal (Preliminary result)   Collection Time   01/14/12 12:51 PM      Component Value Range Comment   Specimen Description BLOOD RIGHT ARM      Special Requests BOTTLES DRAWN AEROBIC AND ANAEROBIC 10CC      Culture  Setup Time 01/14/2012 21:35      Culture        Value:        BLOOD CULTURE RECEIVED NO GROWTH TO DATE CULTURE  WILL BE HELD FOR 5 DAYS BEFORE ISSUING [name] FINAL NEGATIVE REPORT   Report Status PENDING     HEPARIN LEVEL (UNFRACTIONATED)     Status: Abnormal   Collection Time   01/14/12  4:24 PM      Component Value Range Comment   Heparin Unfractionated 0.13 (*) 0.30 - 0.70 IU/mL   RHEUMATOID FACTOR     Status: Abnormal   Collection Time   01/14/12   4:24 PM      Component Value Range Comment   Rheumatoid Factor 36 (*) <=14 IU/mL   HEPARIN LEVEL (UNFRACTIONATED)     Status: Abnormal   Collection Time   01/15/12 12:03 AM      Component Value Range Comment   Heparin Unfractionated 0.23 (*) 0.30 - 0.70 IU/mL   CBC     Status: Abnormal   Collection Time   01/15/12  5:00 AM      Component Value Range Comment   WBC 7.1  4.0 - 10.5 K/uL    RBC 4.17 (*) 4.22 - 5.81 MIL/uL    Hemoglobin 12.0 (*) 13.0 - 17.0 g/dL    HCT 16.1 (*) 09.6 - 52.0 %    MCV 89.2  78.0 - 100.0 fL    MCH 28.8  26.0 - 34.0 pg    MCHC 32.3  30.0 - 36.0 g/dL    RDW 04.5  40.9 - 81.1 %    Platelets 499 (*) 150 - 400 K/uL   CBC     Status: Abnormal   Collection Time   01/15/12  2:56 PM      Component Value Range Comment   WBC 5.7  4.0 - 10.5 K/uL    RBC 4.40  4.22 - 5.81 MIL/uL    Hemoglobin 12.8 (*) 13.0 - 17.0 g/dL    HCT 91.4  78.2 - 95.6 %    MCV 88.9  78.0 - 100.0 fL    MCH 29.1  26.0 - 34.0 pg    MCHC 32.7  30.0 - 36.0 g/dL    RDW 21.3  08.6 - 57.8 %    Platelets 449 (*) 150 - 400 K/uL   CREATININE, SERUM     Status: Normal   Collection Time   01/15/12  2:56 PM      Component Value Range Comment   Creatinine, Ser 0.80  0.50 - 1.35 mg/dL    GFR calc non Af Amer >90  >90 mL/min    GFR calc Af Amer >90  >90 mL/min   URINALYSIS, ROUTINE W REFLEX MICROSCOPIC     Status: Abnormal   Collection Time   01/15/12  3:15 PM      Component Value Range Comment   Color, Urine YELLOW  YELLOW    APPearance CLEAR  CLEAR    Specific Gravity, Urine 1.042 (*) 1.005 - 1.030    pH 6.0  5.0 - 8.0    Glucose, UA NEGATIVE  NEGATIVE mg/dL    Hgb urine dipstick NEGATIVE  NEGATIVE    Bilirubin Urine NEGATIVE  NEGATIVE    Ketones, ur NEGATIVE  NEGATIVE mg/dL    Protein, ur NEGATIVE  NEGATIVE mg/dL    Urobilinogen, UA 1.0  0.0 - 1.0 mg/dL    Nitrite NEGATIVE  NEGATIVE    Leukocytes, UA NEGATIVE  NEGATIVE MICROSCOPIC NOT DONE ON URINES WITH NEGATIVE PROTEIN, BLOOD,  LEUKOCYTES, NITRITE, OR GLUCOSE <1000 mg/dL.    Dg Chest 2 View  01/14/2012  *RADIOLOGY REPORT*  Clinical Data: Fever  CHEST - 2 VIEW  Comparison: 06/06/2010  Findings: Lungs are essentially clear. No pleural effusion or pneumothorax.  Cardiomediastinal silhouette is within normal limits.  Mild degenerative changes of the visualized thoracolumbar spine.  IMPRESSION: No evidence of acute cardiopulmonary disease.   Original Report Authenticated By: Charline Bills, M.D.    Dg Hand Complete Left  01/14/2012  *RADIOLOGY REPORT*  Clinical Data: Pain and swelling in the hands bilaterally.  LEFT HAND - COMPLETE 3+ VIEW  Comparison: No priors.  Findings: Three views of the left hand demonstrate no acute fracture, subluxation or dislocation.  There is mild joint space narrowing, subchondral sclerosis, subchondral cyst formation and osteophyte formation in the DIP joints and first MCP joint, compatible with mild osteoarthritis.  IMPRESSION: 1.  No acute radiographic abnormality of the left hand. 2.  Changes of mild osteoarthritis, as above.   Original Report Authenticated By: Florencia Reasons, M.D.    Dg Hand Complete Right  01/14/2012  *RADIOLOGY REPORT*  Clinical Data: Pain and swelling in the hands bilaterally.  RIGHT HAND - COMPLETE 3+ VIEW  Comparison: No priors.  Findings: The three views of the right hand demonstrate no definite acute displaced fracture, subluxation or dislocation.  There is [name] well corticated fragment adjacent to the ulnar styloid, compatible with an old ulnar styloid avulsion fracture.  Multifocal mild joint space narrowing, subchondral sclerosis, subchondral cyst formation and osteophyte formation is noted, most pronounced in the DIP joints and the first MCP joint, compatible with mild osteoarthritis.  IMPRESSION: 1.  No acute radiographic abnormality of the right hand. 2.  Mild osteoarthritis, as above. 3.  Old ulnar styloid avulsion fracture, incidentally noted.   Original Report  Authenticated By: Florencia Reasons, M.D.     Review of Systems  All other systems reviewed and are negative.   Blood pressure 152/100, pulse 94, temperature 98.6 F (37 C), temperature source Oral, resp. rate 16, height 5\' 5"  (1.651 m), weight 58.7 kg (129 lb 6.6 oz), SpO2 92.00%. Physical Exam  Constitutional: He is oriented to person, place, and time. He appears well-developed and well-nourished.  HENT:  Head: Normocephalic and atraumatic.  Cardiovascular: Normal rate.   Respiratory: Effort normal.  Musculoskeletal:       Hands: Neurological: He is alert and oriented to person, place, and time.  Psychiatric: He has [name] normal mood and affect. His behavior is normal. Judgment and thought content normal.    Assessment/Plan:               As above  Have reviewed arteriogram and examined patient at bedside  At this point no surgical intervention needed  Vessel disease to distal for anything but sympathectomies and doubt this is vasospastic in nature  Would recommend avoidance of cold, ASA daily and followup in my office after discharge  May want to try Lyrica or Neurontin for nerve pain Sean [name] 01/15/2012, 5:15 PM

## 2012-01-15 NOTE — Progress Notes (Addendum)
-  agree with above. -call hand surgery. -ANA +1:320, BC negative, RF +, C3 & C4 and ANCA  CCRP, MPO/PR-3 orders. No lung or kidney involvement. 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  -d/c vancomycin. -call rhematology

## 2012-01-15 NOTE — Op Note (Signed)
OPERATIVE NOTE   PROCEDURE: 1.  Right common femoral artery cannulation under ultrasound guidance 2.  Aortogram 3.  Third order arterial selection (right) 4.  Second order arterial selection (left) 4.  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 male who presents with bilateral finger ischemia.  The patient presents for: arch aortogram and bilateral arm angiograms.  I discussed with the patient the nature of angiographic procedures, especially the limited patencies of any endovascular intervention.  The patient is aware of that the risks of an angiographic procedure include but are not limited to: bleeding, infection, access site complications, renal failure, embolization, rupture of vessel, dissection, possible need for emergent surgical intervention, possible need for surgical procedures to treat the patient's pathology, and stroke and death.  The patient is aware of the risks and agrees to proceed.  DESCRIPTION: After full informed consent was obtained from the patient, the patient was brought back to the angiography suite.  The patient was placed supine upon the angiography table and connected to monitoring equipment.  The  patient was then given conscious sedation, the amounts of which are documented in the patient's chart.  The patient was prepped and drape in the standard fashion for an angiographic procedure.  At this point, attention was turned to the right groin.  Under ultrasound guidance, the right common femoral artery will be cannulated with a 18 gauge needle.  The [facility] wire was passed up into the aorta.  The needle was exchanged for a 5-Fr sheath, which was advanced over the wire into the common femoral artery.  The dilator was then removed.  The pigtail catheter was then loaded over the wire up to the ascending aorta.  The catheter was connected to the power injector circuit.  After de-airring and de-clotting the circuit, a power injector arch aortogram was completed at 40 degrees LAO.  I then exchanged the catheter in the descending aorta for a H1 catheter.  However, using this combination of wire and catheter, I could not start in the right subclavian artery.  I exchanged the catheter in the descending aorta for a BER-2 catheter.  Using this combination, I was able to select the innominate artery and select out the subclavian artery and advanced the catheter into the axillary artery.  Injection in stages were taken of the right arm to image its arteries.  I replaced the wire in the catheter and pulled them both back into the aortic arch.  I then selected the left subclavian artery with the catheter.  Unfortunately, the wire and catheter would not pass into the left subclavian artery.  I placed a Glidewire through the catheter into the axillary artery with some difficulty, as the wire repeated selected the vertebral artery.  I advanced the catheter into the left axillary artery.  Injection in stages were taken of the left arm to image its arteries.  Based on these images, there is no endovascular or surgical option in this patient.  I replaced the wire in the catheter and pulled them both back into the aortic arch and  then removed both together.  The sheath was aspirated.  No clots were present and the sheath was reloaded with heparinized saline.    COMPLICATIONS: none  CONDITION: stable   Leonides Sake, MD [facility] [place] Office: [phone] Pager: [phone]  01/15/2012, 1:39 PM

## 2012-01-15 NOTE — Care Management Note (Signed)
    Page 1 of 2   01/19/2012     3:26:03 PM   CARE MANAGEMENT NOTE 01/19/2012  Patient:  Sean [name], Sean [name]   Account Number:  [id number]  Date Initiated:  01/15/2012  Documentation initiated by:  Letha Cape  Subjective/Objective Assessment:   dx ischemic finger  admit- lives with mom and brother. pta independent.     Action/Plan:   Anticipated DC Date:  01/19/2012   Anticipated DC Plan:  HOME/SELF CARE      DC Planning Services  CM consult      Choice offered to / List presented to:             Status of service:  Completed, signed off Medicare Important Message given?   (If response is "NO", the following Medicare IM given date fields will be blank) Date Medicare IM given:   Date Additional Medicare IM given:    Discharge Disposition:  HOME/SELF CARE  Per UR Regulation:  Reviewed for med. necessity/level of care/duration of stay  If discussed at Long Length of Stay Meetings, dates discussed:    Comments:  01/19/12 15:24 Letha Cape RN, BSN [phone] patient for dc today, patient will need med ast, MD will give me scripts and I will tube down to main pharmacy.  01/16/12 15:08 Letha Cape RN, BSN [phone] patient will be for dc on 10/16, I [facility] and they could not find patient in their system, the [facility] rep stated that patient will have to call the eligiblility phone number to get signed up with [facility] . I gave patient the VA eligiblity phone number, he states when his mother come to see him today he will call them on her phone because it is long distance and he will need to give them  quite a bit of personal information.   Patient states he will need ast with his meds at discharge tomorrow.  01/15/12 16:12 Letha Cape RN, BSN [phone] patient lives with mom and brother, patient helps his mother and brother out at home, brother uses a w/chair. Patient does not have any insurance, but he is eligible for med ast, patient has transportation.  Pta independent.  Patient states he is a veteran, I will check to see if patient has va benefits. I called and left a message with [facility], awaiting call back.

## 2012-01-15 NOTE — Progress Notes (Signed)
ANTICOAGULATION CONSULT NOTE - Follow Up Consult  Pharmacy Consult for heparin Indication: finger ischemia  Labs:  Basename 01/15/12 0003 01/14/12 1624 01/14/12 0908 01/14/12 0245 01/13/12 2257 01/13/12 2024 01/13/12 1629  HGB -- -- -- 12.0* -- -- 12.7*  HCT -- -- -- 36.6* -- -- 39.4  PLT -- -- -- 467* -- -- 495*  APTT -- -- -- -- -- 55* --  LABPROT -- -- -- 14.8 -- 14.5 --  INR -- -- -- 1.18 -- 1.15 --  HEPARINUNFRC 0.23* 0.13* 0.10* -- -- -- --  CREATININE -- -- -- 0.96 0.89 -- --  CKTOTAL -- -- -- -- -- -- --  CKMB -- -- -- -- -- -- --  TROPONINI -- -- -- -- -- -- --    Assessment: 51yo male remains subtherapeutic on heparin though getting closer to goal.  Spoke with lab re: lab not resulting, apparently there have been issues with sample's label, says it was drawn ~0000 but lab didn't receive sample until after 0300 and could not result until 0530, so unclear whether this has affected result.  Goal of Therapy:  Heparin level 0.3-0.7 units/ml   Plan:  Will increase heparin gtt to 1700 units/hr and check level in 6hr.  Colleen Can PharmD BCPS 01/15/2012,5:41 AM

## 2012-01-15 NOTE — H&P (Signed)
  [facility]   History and Physical Update  The patient was interviewed and re-examined.  The patient's previous History and Physical has been reviewed and is unchanged from Dr. Estanislado Spire consult on: 10/13/3.  There is no change in the plan of care: arch aortogram, bilateral upper extremity angiogram.  I discussed with the patient the nature of angiographic procedures, especially the limited patencies of any endovascular intervention.  The patient is aware of that the risks of an angiographic procedure include but are not limited to: bleeding, infection, access site complications, renal failure, embolization, rupture of vessel, dissection, possible need for emergent surgical intervention, possible need for surgical procedures to treat the patient's pathology, and stroke and death.  The patient is aware of the risks and agrees to proceed.  Leonides Sake, MD [facility] [place] Office: [phone] Pager: [phone]  01/15/2012, 11:48 AM

## 2012-01-16 LAB — CBC
MCV: 88.9 fL (ref 78.0–100.0)
Platelets: 453 10*3/uL — ABNORMAL HIGH (ref 150–400)
RBC: 4.07 MIL/uL — ABNORMAL LOW (ref 4.22–5.81)
RDW: 13.5 % (ref 11.5–15.5)
WBC: 6.3 10*3/uL (ref 4.0–10.5)

## 2012-01-16 LAB — BASIC METABOLIC PANEL
CO2: 27 mEq/L (ref 19–32)
Calcium: 8.5 mg/dL (ref 8.4–10.5)
Creatinine, Ser: 0.79 mg/dL (ref 0.50–1.35)
GFR calc Af Amer: >90 mL/min (ref >90)
GFR calc non Af Amer: >90 mL/min (ref >90)
Sodium: 133 mEq/L — ABNORMAL LOW (ref 135–145)

## 2012-01-16 LAB — HIV-1 RNA QUANT-NO REFLEX-BLD
HIV 1 RNA Quant: <20 {copies}/mL
HIV-1 RNA Quant, Log: <1.3 {Log}

## 2012-01-16 LAB — SEDIMENTATION RATE: Sed Rate: 73 mm/hr — ABNORMAL HIGH (ref 0–16)

## 2012-01-16 LAB — ANCA SCREEN W REFLEX TITER
c-ANCA Screen: NEGATIVE
p-ANCA Screen: NEGATIVE

## 2012-01-16 LAB — CYCLIC CITRUL PEPTIDE ANTIBODY, IGG: Cyclic Citrullin Peptide Ab: <2 U/mL (ref 0.0–5.0)

## 2012-01-16 LAB — MPO/PR-3 (ANCA) ANTIBODIES
Myeloperoxidase Abs: 31 AU/mL — ABNORMAL HIGH (ref <20)
Serine Protease 3: 1 AU/mL (ref <20)

## 2012-01-16 MED ORDER — GABAPENTIN 300 MG PO CAPS
300.0000 mg | ORAL_CAPSULE | Freq: Three times a day (TID) | ORAL | Status: DC
  Filled 2012-01-16 (×3): qty 1

## 2012-01-16 MED ORDER — GABAPENTIN 300 MG PO CAPS
300.0000 mg | ORAL_CAPSULE | Freq: Three times a day (TID) | ORAL | Status: DC
  Administered 2012-01-18 – 2012-01-19 (×4): 300 mg via ORAL
  Filled 2012-01-16 (×6): qty 1

## 2012-01-16 MED ORDER — GABAPENTIN 300 MG PO CAPS
300.0000 mg | ORAL_CAPSULE | Freq: Once | ORAL | Status: AC
  Administered 2012-01-16: 300 mg via ORAL
  Filled 2012-01-16: qty 1

## 2012-01-16 MED ORDER — GABAPENTIN 300 MG PO CAPS
300.0000 mg | ORAL_CAPSULE | Freq: Two times a day (BID) | ORAL | Status: DC
  Filled 2012-01-16 (×2): qty 1

## 2012-01-16 MED ORDER — GABAPENTIN 300 MG PO CAPS
300.0000 mg | ORAL_CAPSULE | Freq: Two times a day (BID) | ORAL | Status: AC
  Administered 2012-01-17 (×2): 300 mg via ORAL
  Filled 2012-01-16 (×2): qty 1

## 2012-01-16 NOTE — Consult Note (Signed)
Reason for Consult:possible vasculitis Referring Physician: Dr. Cyndra Numbers is an 51 y.o. male.  HPI: Mr Maberry is previously healthy user of cocaine who abruptly developed pain in most of his joints about 2 weeks ago. The pain was severe and associated with pronounced swelling of his arms and knees as well as ankles. On Thursday of last week (about 5 days ago), he developed abrupt sharp pain in the tips of this bilateral index fingers with purplish discoloration. He came to ER and was given pain meds he returned on Saturday (3 days ago) and was admitted to hospital for finger pain.  He denies ever having anything quite like this, either the skin changes or rash.  He does regularly use cocaine but does not smoke.  Since being in the hospital, he states he has felt much better.  History reviewed. No pertinent past medical history.  Past Surgical History  Procedure Date  . Elbow surgery   . Tonsillectomy     No family history on file.  Social History:  reports that he has never smoked. He does not have any smokeless tobacco history on file. He reports that he drinks alcohol. He reports that he uses illicit drugs (Cocaine) about 5 times per week.  Allergies: No Known Allergies  Medications: I have reviewed the patient's current medications.  Results for orders placed during the hospital encounter of 01/13/12 (from the past 48 hour(s))  HEPARIN LEVEL (UNFRACTIONATED)     Status: Abnormal   Collection Time   01/15/12 12:03 AM      Component Value Range Comment   Heparin Unfractionated 0.23 (*) 0.30 - 0.70 IU/mL   CBC     Status: Abnormal   Collection Time   01/15/12  5:00 AM      Component Value Range Comment   WBC 7.1  4.0 - 10.5 K/uL    RBC 4.17 (*) 4.22 - 5.81 MIL/uL    Hemoglobin 12.0 (*) 13.0 - 17.0 g/dL    HCT 16.1 (*) 09.6 - 52.0 %    MCV 89.2  78.0 - 100.0 fL    MCH 28.8  26.0 - 34.0 pg    MCHC 32.3  30.0 - 36.0 g/dL    RDW 04.5  40.9 - 81.1 %    Platelets  499 (*) 150 - 400 K/uL   CBC     Status: Abnormal   Collection Time   01/15/12  2:56 PM      Component Value Range Comment   WBC 5.7  4.0 - 10.5 K/uL    RBC 4.40  4.22 - 5.81 MIL/uL    Hemoglobin 12.8 (*) 13.0 - 17.0 g/dL    HCT 91.4  78.2 - 95.6 %    MCV 88.9  78.0 - 100.0 fL    MCH 29.1  26.0 - 34.0 pg    MCHC 32.7  30.0 - 36.0 g/dL    RDW 21.3  08.6 - 57.8 %    Platelets 449 (*) 150 - 400 K/uL   CREATININE, SERUM     Status: Normal   Collection Time   01/15/12  2:56 PM      Component Value Range Comment   Creatinine, Ser 0.80  0.50 - 1.35 mg/dL    GFR calc non Af Amer >90  >90 mL/min    GFR calc Af Amer >90  >90 mL/min   ANCA SCREEN W REFLEX TITER     Status: Normal   Collection Time  01/15/12  2:57 PM      Component Value Range Comment   c-ANCA Screen NEGATIVE  NEGATIVE    p-ANCA Screen NEGATIVE  NEGATIVE    Atypical p-ANCA Screen NEGATIVE  NEGATIVE   C3 COMPLEMENT     Status: Normal   Collection Time   01/15/12  2:57 PM      Component Value Range Comment   C3 Complement 141  90 - 180 mg/dL   C4 COMPLEMENT     Status: Normal   Collection Time   01/15/12  2:57 PM      Component Value Range Comment   Complement C4, Body Fluid 24  10 - 40 mg/dL   URINALYSIS, ROUTINE W REFLEX MICROSCOPIC     Status: Abnormal   Collection Time   01/15/12  3:15 PM      Component Value Range Comment   Color, Urine YELLOW  YELLOW    APPearance CLEAR  CLEAR    Specific Gravity, Urine 1.042 (*) 1.005 - 1.030    pH 6.0  5.0 - 8.0    Glucose, UA NEGATIVE  NEGATIVE mg/dL    Hgb urine dipstick NEGATIVE  NEGATIVE    Bilirubin Urine NEGATIVE  NEGATIVE    Ketones, ur NEGATIVE  NEGATIVE mg/dL    Protein, ur NEGATIVE  NEGATIVE mg/dL    Urobilinogen, UA 1.0  0.0 - 1.0 mg/dL    Nitrite NEGATIVE  NEGATIVE    Leukocytes, UA NEGATIVE  NEGATIVE MICROSCOPIC NOT DONE ON URINES WITH NEGATIVE PROTEIN, BLOOD, LEUKOCYTES, NITRITE, OR GLUCOSE <1000 mg/dL.  MPO/PR-3 (ANCA) ANTIBODIES     Status: Abnormal    Collection Time   01/15/12  5:16 PM      Component Value Range Comment   Myeloperoxidase Abs 31 (*) <20 AU/mL    Serine Protease 3 1  <20 AU/mL   CYCLIC CITRUL PEPTIDE ANTIBODY, IGG     Status: Normal   Collection Time   01/15/12  5:16 PM      Component Value Range Comment   Cyclic Citrullin Peptide Ab <6.2  0.0 - 5.0 U/mL   BASIC METABOLIC PANEL     Status: Abnormal   Collection Time   01/16/12  6:10 AM      Component Value Range Comment   Sodium 133 (*) 135 - 145 mEq/L    Potassium 3.8  3.5 - 5.1 mEq/L    Chloride 99  96 - 112 mEq/L    CO2 27  19 - 32 mEq/L    Glucose, Bld 140 (*) 70 - 99 mg/dL    BUN 8  6 - 23 mg/dL    Creatinine, Ser 1.30  0.50 - 1.35 mg/dL    Calcium 8.5  8.4 - 86.5 mg/dL    GFR calc non Af Amer >90  >90 mL/min    GFR calc Af Amer >90  >90 mL/min   CBC     Status: Abnormal   Collection Time   01/16/12  6:10 AM      Component Value Range Comment   WBC 6.3  4.0 - 10.5 K/uL    RBC 4.07 (*) 4.22 - 5.81 MIL/uL    Hemoglobin 11.6 (*) 13.0 - 17.0 g/dL    HCT 78.4 (*) 69.6 - 52.0 %    MCV 88.9  78.0 - 100.0 fL    MCH 28.5  26.0 - 34.0 pg    MCHC 32.0  30.0 - 36.0 g/dL    RDW 29.5  28.4 - 13.2 %  Platelets 453 (*) 150 - 400 K/uL     No results found.  Review of Systems  Constitutional: Negative for fever, chills and weight loss.  HENT: Positive for neck pain. Negative for ear pain and nosebleeds.   Eyes: Negative for blurred vision and redness.  Respiratory: Negative for cough, hemoptysis and shortness of breath.   Cardiovascular: Positive for chest pain and leg swelling.  Gastrointestinal: Negative for abdominal pain, diarrhea, constipation and blood in stool.  Genitourinary: Negative for dysuria and hematuria.  Musculoskeletal: Positive for joint pain.  Skin: Positive for rash. Negative for itching.  Neurological: Negative for sensory change and focal weakness.  Endo/Heme/Allergies: Does not bruise/bleed easily.  Psychiatric/Behavioral: Positive  for substance abuse. Negative for depression.   Blood pressure 130/82, pulse 99, temperature 98.3 F (36.8 C), temperature source Oral, resp. rate 16, height 5\' 5"  (1.651 m), weight 58.7 kg (129 lb 6.6 oz), SpO2 95.00%. Physical Exam  Constitutional: He is oriented to person, place, and time. He appears well-developed and well-nourished.  HENT:  Head: Normocephalic and atraumatic.  Eyes: Right eye exhibits no discharge. Left eye exhibits no discharge. No scleral icterus.  Neck: No tracheal deviation present. No thyromegaly present.  Cardiovascular:       No clubbing cyanosis or edema.Marland Kitchen   Respiratory: No respiratory distress. He has no wheezes. He has no rales.  Musculoskeletal:       Some thickening of each wrist, right greater than left some tenderness particularly along the lateral right wrist.  Narrowing of the middle mcps by palpation with pain. A fullness to the dips without pain.  Knees are cool without effusions. Probable trace synovitis in the tibio-talar joints bilaterally.  Neurological: He is alert and oriented to person, place, and time. No cranial nerve deficit.  Skin: Skin is warm and dry.       Splinter type vasculitic changes on the nails of each index finger. There are also petechial lesions on the finger tips. There are small lesions under the left 3rd nail.  Feet are spared. No malar erythema. There is some patchy erythema around the knees without scale.  Psychiatric: He has a normal mood and affect. His behavior is normal. Judgment normal.    Assessment/Plan: Patient presents with abrupt onset of painful symmetric large and small joints along with digital ischemia of the fingers, but not toes.  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  He also has marked joint pain  and swelling which is also atypical for cocaine-induced vasculitis. He has a positive ANA, however this would be a rather usual initial manifestation of systemic lupus.  An ENA would be useful to look for particular antibodies for SLE, Sjogren's, and Scleroderma.  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  Another possibility might be anti-phospholipid syndrome.  Plan: He has had a good work up thus far, but I would add Hepatitis serologies, HIV, and an ENA as well as sed rate and CRP and APS antibody survey.  With the normal ECHO and no other signs of organ damage, I think he is safe to be discharged. I will follow up on the pending labs and contact patient as necessary.  Obviously, abstinence from cocaine is prudent as well as useful diagnostically. He and I discussed this.   Azzie Roup 01/16/2012, 5:29  PM

## 2012-01-16 NOTE — Progress Notes (Signed)
Changed dressing to right  Groin clean and placed bandage. Site free from hematoma. bruising present at site

## 2012-01-16 NOTE — Progress Notes (Signed)
Clinical Social Work Department BRIEF PSYCHOSOCIAL ASSESSMENT 01/16/2012  Patient:  Sean [name], Sean [name]     Account Number:  [id number]     Admit date:  01/13/2012  Clinical Social Worker:  Dennison Bulla  Date/Time:  01/16/2012 10:45 AM  Referred by:  Physician  Date Referred:  01/16/2012 Referred for  Substance Abuse   Other Referral:   Interview type:  Patient Other interview type:    PSYCHOSOCIAL DATA Living Status:  FAMILY Admitted from facility:   Level of care:   Primary support name:  Britta Mccreedy Primary support relationship to patient:  PARENT Degree of support available:   Adequate    CURRENT CONCERNS Current Concerns  Substance Abuse   Other Concerns:    SOCIAL WORK ASSESSMENT / PLAN CSW received referral due to patient having a positive drug screen for benzos and cocaine. CSW reviewed chart and met with patient at bedside. No visitors present.    CSW introduced myself and explained role. Patient reports he was admitted to the hospital due to problems with his hands. Patient reports that he knows that problems with hands are due to cocaine use. Patient lives with mother and brother. Patient reports that all family members use substances. Mother drinks alcohol and brothers use THC and cocaine. Patient reports that he often drinks with mother and father but reports that alcohol is not his primary drug choice. Patient reports that he drinks about every other day and has about 1-3 beers. Patient has used [facility] for his "whole life". Patient uses cocaine about 5 times a week. Patient reports that he enjoys "having a beer and smoking pot" but realizes that using cocaine is harmful. 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 Patient works with a company but hurt his back a few weeks ago and needs to return to work at [employer].    Patient reports no emotional triggers to using substances. Patient does not feel direct treatment  for alcohol or THC is needed but feels that he needs to quit using cocaine. Patient later reports that he feels he cannot use any substances to be sober because he feels if he drinks or smokes THC then he will want to use cocaine. Patient reports that he has never received formal treatment besides going to a residential treatment facility that his company paid for several years ago. Patient does not feel that treatment was helpful.    CSW and patient discussed treatment plans at dc. CSW and patient completed SBIRT and CSW recommended residential treatment. Patient reports he cares for his mom and brother and cannot leave them at home. Patient aware that residential treatment would assist with removing himself from home situation where drug use occurs daily. CSW and patient discussed intensive outpatient treatment (SAIOP). Patient is worried about returning to work and feels that he needs SAIOP at night. CSW provided patient with options for 9am treatment in [place] [employer] or 4:30pm treatment in [place]. Patient feels he cannot go to [place] and does not want to miss work to go to treatment. CSW and patient had a lengthy conversation regarding the importance of sobriety and attempting to be sober before returning to work. Patient understanding of this plan but reports he will most likely only go to individual therapy. CSW placed information for residential, SAIOP and individual counseling options on AVS. Patient reports no further concerns at this time.    CSW is signing off but available if needed  Assessment/plan status:  No Further Intervention Required Other assessment/ plan:   Information/referral to community resources:   Referral to [facility]  Referral to [facility]    PATIENT'S/FAMILY'S RESPONSE TO PLAN OF CARE: Patient alert and oriented. Patient receptive to CSW consult and open to discuss substance use. Patient reports that he desires treatment but not agreeable to residential or  SAIOP. Patient agreeable to discuss treatment plans with mom to determine if he can attend a higher level of treatment. Patient reports he will follow up with appointments at dc.

## 2012-01-16 NOTE — Progress Notes (Signed)
Patient ID: Sean [name], male   DOB: Mar 19, 1961, 51 y.o.   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 -In the differential diagnoses:, vasculitis, endocarditis, rheumatoid arthritis, SLE and remitting seronegative symmetrical synovitis -Vascular was consulted performed angiogram, finding partial ischemia in the 2nd and 3rd digits on both hands. -2D echo was negative, the patient has not been febrile or had leukocytosis, blood cultures are negative to date, making endocarditis unlikely. -HIV, RPR are neg -ANA and RF are positive, ANCA and PANCA are pending.  Rheumatology has been consulted and will see the patient 10/15.  We appreciate Dr. Ewell Poe assistance. -Bilateral hand x-ray is negative for anything acute.   -Hand Surgery, Dr. Mina Marble has seen the patient.  Surgery is not appropriate, but he did recommend daily aspirin and gabapentin.  Mr. Sean [name] will follow up with Dr. Mina Marble as an outpatient.  Substance Abuse Urine Drug screen is positive for cocaine and benzodiazepines.  Social work consulted for counseling and resources.   Code Status: Full Family Communication: Patient  Disposition Plan: To be determined  Consultants:  Vascular surgery, Dr. Tilman Neat Surgery, Dr. Mina Marble  Rheumatology, Dr. [name]  Procedures:  Angiogram 01/15/2012  Antibiotics:  None (indicate start date, and stop date if known)  HPI/Subjective: Swelling improved.  Feeling better.  Objective: Filed Vitals:   01/15/12 1601 01/15/12 1737 01/15/12 2028 01/15/12 2337  BP: 152/100 143/90 149/93   Pulse:  93 108 104  Temp:   98.6 F (37 C) 98.6 F (37 C)  TempSrc:   Oral Oral  Resp:      Height:      Weight:      SpO2:   96% 95%    Intake/Output Summary (Last 24 hours) at 01/16/12 0802 Last data filed  at 01/16/12 0612  Gross per 24 hour  Intake     80 ml  Output   1080 ml  Net  -1000 ml   Filed Weights   01/13/12 1814 01/13/12 2325  Weight: 63.504 kg (140 lb) 58.7 kg (129 lb 6.6 oz)    Exam:  General: Alert, awake, oriented x3, in no acute distress, talkative HEENT: No bruits, no goiter. He has cavities in his teeth, he has a splinter hemorrhage on his left inner cheek.  Temples appear sunken. Heart: Regular rate and rhythm, systolic murmur is appreciated in the tricuspid area no rubs, gallops.  Lungs: Good air movement, clear to auscultation.  No wheeze, crackles, or rals Abdomen: Soft, nontender, nondistended, positive bowel sounds.  Neuro: Grossly intact, nonfocal. Extremities: Upper ext.  Fingers and hands have decreased swelling today 10/15. 1st and 2nd digits on each hand demonstrate significant splinter hemorrhaging.  Lower ext.  Toes also appear somewhat swollen.  Right dorsum of the foot and ankle with 1+ swelling.  No erythema noted.    Data Reviewed: Basic Metabolic Panel:  Lab 01/16/12 4098 01/15/12 1456 01/14/12 0245 01/13/12 2257 01/09/12 1556  NA 133* -- 133* 135 136  K 3.8 -- 4.1 3.9 4.2  CL 99 -- 101 100 100  CO2 27 -- 23 25 29   GLUCOSE 140* -- 127* 104* 97  BUN 8 -- 10 10 8   CREATININE 0.79 0.80 0.96 0.89 0.84  CALCIUM 8.5 -- 8.6 8.8 9.3  MG -- -- 2.0 -- --  PHOS -- -- -- -- --   Liver Function Tests:  Lab 01/14/12 0245 01/09/12 1556  AST 23 31  ALT 16 26  ALKPHOS 80 88  BILITOT 0.2* 0.3  PROT 8.3 9.1*  ALBUMIN 2.7* 3.1*   CBC:  Lab 01/16/12 0610 01/15/12 1456 01/15/12 0500 01/14/12 0245 01/13/12 1629 01/09/12 1556  WBC 6.3 5.7 7.1 6.1 5.9 --  NEUTROABS -- -- -- -- -- 4.6  HGB 11.6* 12.8* 12.0* 12.0* 12.7* --  HCT 36.2* 39.1 37.2* 36.6* 39.4 --  MCV 88.9 88.9 89.2 88.2 89.1 --  PLT 453* 449* 499* 467* 495* --     Recent Results (from the past 240 hour(s))  CULTURE, BLOOD (ROUTINE X 2)     Status: Normal (Preliminary result)    Collection Time   01/14/12  1:50 AM      Component Value Range Status Comment   Specimen Description BLOOD RIGHT ARM   Final    Special Requests BOTTLES DRAWN AEROBIC AND ANAEROBIC 10CC EA   Final    Culture  Setup Time 01/14/2012 14:18   Final    Culture     Final    Value:        BLOOD CULTURE RECEIVED NO GROWTH TO DATE CULTURE WILL BE HELD FOR 5 DAYS BEFORE ISSUING A FINAL NEGATIVE REPORT   Report Status PENDING   Incomplete   CULTURE, BLOOD (ROUTINE X 2)     Status: Normal (Preliminary result)   Collection Time   01/14/12  1:55 AM      Component Value Range Status Comment   Specimen Description BLOOD RIGHT HAND   Final    Special Requests BOTTLES DRAWN AEROBIC AND ANAEROBIC 10CC EA   Final    Culture  Setup Time 01/14/2012 14:19   Final    Culture     Final    Value:        BLOOD CULTURE RECEIVED NO GROWTH TO DATE CULTURE WILL BE HELD FOR 5 DAYS BEFORE ISSUING A FINAL NEGATIVE REPORT   Report Status PENDING   Incomplete   CULTURE, BLOOD (ROUTINE X 2)     Status: Normal (Preliminary result)   Collection Time   01/14/12 12:48 PM      Component Value Range Status Comment   Specimen Description BLOOD RIGHT HAND   Final    Special Requests BOTTLES DRAWN AEROBIC ONLY 10CC   Final    Culture  Setup Time 01/14/2012 21:35   Final    Culture     Final    Value:        BLOOD CULTURE RECEIVED NO GROWTH TO DATE CULTURE WILL BE HELD FOR 5 DAYS BEFORE ISSUING A FINAL NEGATIVE REPORT   Report Status PENDING   Incomplete   CULTURE, BLOOD (ROUTINE X 2)     Status: Normal (Preliminary result)   Collection Time   01/14/12 12:51 PM      Component Value Range Status Comment   Specimen Description BLOOD RIGHT ARM   Final    Special Requests BOTTLES DRAWN AEROBIC AND ANAEROBIC 10CC   Final    Culture  Setup Time 01/14/2012 21:35   Final    Culture     Final    Value:        BLOOD CULTURE RECEIVED NO GROWTH TO DATE CULTURE WILL BE HELD FOR 5 DAYS BEFORE ISSUING A FINAL NEGATIVE REPORT   Report  Status PENDING   Incomplete      Studies:  Arteriogram 01/15/12  FINDING(S):  Widely patent Type II aortic arch  Widely patent innominate , left common carotid , and left subclavian arteries  Patent bilateral vertebral arteries  Patent right subclavian, common carotid, axillary, brachial, radial and ulnar arteries  Diseased right digital arteries with partial occlusion in second and third fingers  Patent left subclavian, axillary, brachial, radial and ulnar arteries  Diseased left digital arteries with partial occlusion in second and third fingers (worse than right side)    Dg Hand Complete Left  01/14/2012  *RADIOLOGY REPORT*  Clinical Data: Pain and swelling in the hands bilaterally.  LEFT HAND - COMPLETE 3+ VIEW  Comparison: No priors.  Findings: Three views of the left hand demonstrate no acute fracture, subluxation or dislocation.  There is mild joint space narrowing, subchondral sclerosis, subchondral cyst formation and osteophyte formation in the DIP joints and first MCP joint, compatible with mild osteoarthritis.  IMPRESSION: 1.  No acute radiographic abnormality of the left hand. 2.  Changes of mild osteoarthritis, as above.   Original Report Authenticated By: Florencia Reasons, M.D.    Dg Hand Complete Right  01/14/2012  *RADIOLOGY REPORT*  Clinical Data: Pain and swelling in the hands bilaterally.  RIGHT HAND - COMPLETE 3+ VIEW  Comparison: No priors.  Findings: The three views of the right hand demonstrate no definite acute displaced fracture, subluxation or dislocation.  There is a well corticated fragment adjacent to the ulnar styloid, compatible with an old ulnar styloid avulsion fracture.  Multifocal mild joint space narrowing, subchondral sclerosis, subchondral cyst formation and osteophyte formation is noted, most pronounced in the DIP joints and the first MCP joint, compatible with mild osteoarthritis.  IMPRESSION: 1.  No acute radiographic abnormality of the right hand.  2.  Mild osteoarthritis, as above. 3.  Old ulnar styloid avulsion fracture, incidentally noted.   Original Report Authenticated By: Florencia Reasons, M.D.     Scheduled Meds:    . aspirin  81 mg Oral Daily  . enoxaparin  40 mg Subcutaneous Q24H  . fentaNYL      . gabapentin  300 mg Oral Once  . gabapentin  300 mg Oral BID  . gabapentin  300 mg Oral TID  . lidocaine      . midazolam      . sodium chloride  3 mL Intravenous Q12H  . DISCONTD: vancomycin  750 mg Intravenous Q12H   Continuous Infusions:    . sodium chloride 20 mL/hr (01/16/12 0255)  . DISCONTD: sodium chloride 1 mL/kg/hr (01/15/12 1407)  . DISCONTD: heparin 1,700 Units/hr (01/15/12 0559)     Stephani Police  [facility] Pager [phone].  If 7PM-7AM, please contact night-coverage at [email or web address], password [email or web address] 01/16/2012, 8:02 AM  LOS: 3 days    Attending -patient seen and examined, I agree with assessment and plan. 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 I have counseled the patient extensively regarding importance of stopping Cocaine use  Windell Norfolk MD

## 2012-01-16 NOTE — Progress Notes (Signed)
[facility]  Subjective  - POD #1, s/p angiogram  C/O worsening bilateral finger pain, preventing him from resting   Physical Exam:  CV:  Palp radial pulses bilaterally Dusky appearance to bilateral index fingers Gen:  apeears very uncomfortable       Assessment/Plan:  POD #1  -No obvious source of decreased digital blood flow seen on angiogram. -will defer to Dr. Mina Marble for management.  May also try NTG patch    BRABHAM IV, V. WELLS 01/16/2012 2:27 PM --  Filed Vitals:   01/16/12 1407  BP: 130/82  Pulse: 99  Temp: 98.3 F (36.8 C)  Resp:     Intake/Output Summary (Last 24 hours) at 01/16/12 1427 Last data filed at 01/16/12 1300  Gross per 24 hour  Intake    560 ml  Output   1080 ml  Net   -520 ml     Laboratory CBC    Component Value Date/Time   WBC 6.3 01/16/2012 0610   HGB 11.6* 01/16/2012 0610   HCT 36.2* 01/16/2012 0610   PLT 453* 01/16/2012 0610    BMET    Component Value Date/Time   NA 133* 01/16/2012 0610   K 3.8 01/16/2012 0610   CL 99 01/16/2012 0610   CO2 27 01/16/2012 0610   GLUCOSE 140* 01/16/2012 0610   BUN 8 01/16/2012 0610   CREATININE 0.79 01/16/2012 0610   CALCIUM 8.5 01/16/2012 0610   GFRNONAA >90 01/16/2012 0610   GFRAA >90 01/16/2012 0610    COAG Lab Results  Component Value Date   INR 1.18 01/14/2012   INR 1.15 01/13/2012   INR 1.06 08/11/2010   No results found for this basename: PTT    Antibiotics Anti-infectives     Start     Dose/Rate Route Frequency Ordered Stop   01/14/12 1230   vancomycin (VANCOCIN) 750 mg in sodium chloride 0.9 % 150 mL IVPB  Status:  Discontinued        750 mg 150 mL/hr over 60 Minutes Intravenous Every 12 hours 01/14/12 1221 01/15/12 1558           V. Charlena Cross, M.D. [facility] Office: [phone] Pager:  [phone]

## 2012-01-17 DIAGNOSIS — R509 Fever, unspecified: Secondary | ICD-10-CM | POA: Diagnosis present

## 2012-01-17 DIAGNOSIS — F1910 Other psychoactive substance abuse, uncomplicated: Secondary | ICD-10-CM | POA: Diagnosis present

## 2012-01-17 LAB — C-REACTIVE PROTEIN: CRP: 3.5 mg/dL — ABNORMAL HIGH (ref <0.60)

## 2012-01-17 LAB — CARDIOLIPIN ANTIBODIES, IGG, IGM, IGA
Anticardiolipin IgA: 10 APL U/mL — ABNORMAL LOW (ref <22)
Anticardiolipin IgM: 5 MPL U/mL — ABNORMAL LOW (ref <11)

## 2012-01-17 LAB — LUPUS ANTICOAGULANT PANEL: Lupus Anticoagulant: NOT DETECTED

## 2012-01-17 LAB — HEPATITIS PANEL, ACUTE
HCV Ab: REACTIVE — AB
Hep A IgM: NEGATIVE
Hepatitis B Surface Ag: NEGATIVE

## 2012-01-17 LAB — EXTRACTABLE NUCLEAR ANTIGEN ANTIBODY
SSA (Ro) (ENA) Antibody, IgG: 124 AU/mL — ABNORMAL HIGH (ref <30)
SSB (La) (ENA) Antibody, IgG: <1 AU/mL (ref <30)

## 2012-01-17 LAB — HIV ANTIBODY (ROUTINE TESTING W REFLEX): HIV: NONREACTIVE

## 2012-01-17 LAB — BETA-2-GLYCOPROTEIN I ABS, IGG/M/A: Beta-2-Glycoprotein I IgA: 1 A Units (ref <20)

## 2012-01-17 MED ORDER — CHLORDIAZEPOXIDE HCL 25 MG PO CAPS: 25.0000 mg | ORAL_CAPSULE | Freq: Every day | ORAL | Status: DC

## 2012-01-17 MED ORDER — ONDANSETRON 4 MG PO TBDP
4.0000 mg | ORAL_TABLET | Freq: Four times a day (QID) | ORAL | Status: DC | PRN
  Filled 2012-01-17: qty 1

## 2012-01-17 MED ORDER — THIAMINE HCL 100 MG/ML IJ SOLN
100.0000 mg | Freq: Once | INTRAMUSCULAR | Status: AC
  Administered 2012-01-17: 100 mg via INTRAMUSCULAR
  Filled 2012-01-17: qty 1

## 2012-01-17 MED ORDER — CHLORDIAZEPOXIDE HCL 25 MG PO CAPS
25.0000 mg | ORAL_CAPSULE | Freq: Four times a day (QID) | ORAL | Status: AC
  Administered 2012-01-17 (×2): 25 mg via ORAL

## 2012-01-17 MED ORDER — CHLORDIAZEPOXIDE HCL 25 MG PO CAPS
50.0000 mg | ORAL_CAPSULE | Freq: Once | ORAL | Status: AC
  Administered 2012-01-17: 50 mg via ORAL
  Filled 2012-01-17: qty 2

## 2012-01-17 MED ORDER — CHLORDIAZEPOXIDE HCL 25 MG PO CAPS
25.0000 mg | ORAL_CAPSULE | Freq: Three times a day (TID) | ORAL | Status: AC
  Filled 2012-01-17: qty 1

## 2012-01-17 MED ORDER — CHLORDIAZEPOXIDE HCL 25 MG PO CAPS: 25.0000 mg | ORAL_CAPSULE | ORAL | Status: DC

## 2012-01-17 MED ORDER — ADULT MULTIVITAMIN W/MINERALS CH
1.0000 | ORAL_TABLET | Freq: Every day | ORAL | Status: DC
  Administered 2012-01-17 – 2012-01-19 (×3): 1 via ORAL
  Filled 2012-01-17 (×3): qty 1

## 2012-01-17 MED ORDER — CHLORDIAZEPOXIDE HCL 25 MG PO CAPS
25.0000 mg | ORAL_CAPSULE | Freq: Four times a day (QID) | ORAL | Status: DC | PRN
  Filled 2012-01-17: qty 1

## 2012-01-17 MED ORDER — HYDROXYZINE HCL 25 MG PO TABS
25.0000 mg | ORAL_TABLET | Freq: Four times a day (QID) | ORAL | Status: DC | PRN
  Filled 2012-01-17: qty 1

## 2012-01-17 MED ORDER — VITAMIN B-1 100 MG PO TABS
100.0000 mg | ORAL_TABLET | Freq: Every day | ORAL | Status: DC
  Administered 2012-01-18 – 2012-01-19 (×2): 100 mg via ORAL
  Filled 2012-01-17 (×2): qty 1

## 2012-01-17 MED ORDER — LOPERAMIDE HCL 2 MG PO CAPS: 2.0000 mg | ORAL_CAPSULE | ORAL | Status: DC | PRN

## 2012-01-17 NOTE — Progress Notes (Signed)
Addendum  Patient seen and examined, chart and data base reviewed.  I agree with the above assessment and plan.  For full details please see Mrs. Algis Downs PA. Note.  Ischemic fingers with splinter hemorrhage, etiology unclear till now.  Clint Lipps, MD [facility] Pager: [phone] 01/17/2012, 5:05 PM

## 2012-01-17 NOTE — Progress Notes (Signed)
Patient ID: PARMVIR URE, male   DOB: 05-23-1960, 51 y.o.   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 -In the differential diagnoses:, vasculitis, endocarditis, rheumatoid arthritis, SLE and remitting seronegative symmetrical synovitis -Vascular was consulted performed angiogram, finding partial ischemia in the 2nd and 3rd digits on both hands. -2D echo was negative, the patient has not been febrile or had leukocytosis, blood cultures are negative to date, making endocarditis unlikely. -HIV, RPR are neg -ANA and RF are positive, ANCA and PANCA are pending.   -Rheumatology has seen the patient drawn additional labs and will have the patient follow up in their office if necessary. He is evaluating for SLE, Sjogren's, and Scleroderma.  Another possibility might be anti-phospholipid syndrome.   We appreciate Dr. Ewell Poe assistance. -Bilateral hand x-ray is negative for anything acute.   -Hand Surgery, Dr. Mina Marble has seen the patient.  Surgery is not appropriate, but he did recommend daily aspirin and gabapentin.  Mr. Barentine will follow up with Dr. Mina Marble as an outpatient.  Substance Abuse -Urine Drug screen is positive for cocaine and benzodiazepines. Patient admitted to social work that he drinks alcohol excessively as well.  Tachycardia and low grade fever.  -over night 01/16/2012 patient developed low grade fever, agitation, and tachycardia.  He has been started on a librium protocol for likely ETOH withdraw.  Code Status: Full Family Communication: Patient  Disposition Plan: To be determined  Consultants:  Vascular surgery, Dr. Tilman Neat Surgery, Dr. Mina Marble  Rheumatology, Dr. [name]  Procedures:  Angiogram 01/15/2012  Antibiotics:  None (indicate start date, and stop date if known)  HPI/Subjective: Complaining of pain  in all of his joints.  "Feels grouchy"  Objective: Filed Vitals:   01/17/12 0009 01/17/12 0459 01/17/12 1119 01/17/12 1417  BP:  154/97 127/81 107/70  Pulse:  106 115 98  Temp: 98.8 F (37.1 C) 97.4 F (36.3 C) 98.5 F (36.9 C) 98.1 F (36.7 C)  TempSrc: Oral Oral Oral Oral  Resp:  20 18 18   Height:      Weight:      SpO2:  97% 98% 97%    Intake/Output Summary (Last 24 hours) at 01/17/12 1524 Last data filed at 01/17/12 0500  Gross per 24 hour  Intake   1900 ml  Output      0 ml  Net   1900 ml   Filed Weights   01/13/12 1814 01/13/12 2325  Weight: 63.504 kg (140 lb) 58.7 kg (129 lb 6.6 oz)    Exam:  General: Alert, awake, oriented.  Lying on the bed talking on the phone to his aunt. HEENT: No bruits, no goiter. He has cavities in his teeth, he has a splinter hemorrhage on his left inner cheek.  Temples appear sunken. Heart: Slightly tachy, Regular rhythm, systolic murmur is appreciated in the tricuspid area no rubs, gallops.  Lungs: Good air movement, clear to auscultation.  No wheeze, crackles, or rals Abdomen: Soft, nontender, nondistended, positive bowel sounds.  Neuro: Grossly intact, nonfocal. Extremities: Upper ext.  Fingers and hands have decreased swelling today 10/15. 1st and 2nd digits on each hand demonstrate significant splinter hemorrhaging.  Lower ext.  Toes also appear somewhat swollen.  Right dorsum of the foot and ankle with 1+ swelling.  No erythema noted.    Data Reviewed:  Results for ELUTERIO, NICOLAOU (MRN [id number]) as of 01/17/2012 15:34  Ref. Range 01/16/2012 18:15  Sed Rate Latest Range: 0-16 mm/hr 73 (H)  Anticardiolipin IgA Latest Range: <22 APL U/mL 10 (L)  Anticardiolipin IgG Latest Range: <23 GPL U/mL 11 (L)  Anticardiolipin IgM Latest Range: <11 MPL U/mL 5 (L)  PTT Lupus Anticoagulant Latest Range: 28.0-43.0 secs 43.0  PTTLA Confirmation Latest Range: <8.0 secs NOT APPL  PTTLA 4:1 Mix Latest Range: 28.0-43.0 secs 42.4  DRVVT  Latest Range: <42.9 secs 40.7  Drvvt confirmation Latest Range: <1.11 Ratio NOT APPL  dRVVT Incubated 1:1 Mix Latest Range: <42.9 secs NOT APPL  Lupus Anticoagulant Latest Range: NOT DETECTED  NOT DETECTED  Beta-2 Glyco I IgG Latest Range: <20 G Units 0  Beta-2-Glycoprotein I IgA Latest Range: <20 A Units 1  Beta-2-Glycoprotein I IgM Latest Range: <20 M Units 16  ds DNA Ab Latest Range: <30 IU/mL 39 (H)  ENA Latest Range: <30 AU/mL 1  SSA (Ro) (ENA) Antibody, IgG Latest Range: <30 AU/mL 124 (H)  SSB (La) (ENA) Antibody, IgG Latest Range: <30 AU/mL <1  Scleroderma (Scl-70) (ENA) Antibody, IgG Latest Range: <30 AU/mL <1  SM/RNP Latest Range: <30 AU/mL <1  Hep A IgM Latest Range: NEGATIVE  NEGATIVE  Hepatitis B Surface Ag Latest Range: NEGATIVE  NEGATIVE  Hep B C IgM Latest Range: NEGATIVE  NEGATIVE  HCV Ab Latest Range: NEGATIVE  Reactive (A)  HIV Latest Range: NON REACTIVE  NON REACTIVE   Basic Metabolic Panel:  Lab 01/16/12 1610 01/15/12 1456 01/14/12 0245 01/13/12 2257  NA 133* -- 133* 135  K 3.8 -- 4.1 3.9  CL 99 -- 101 100  CO2 27 -- 23 25  GLUCOSE 140* -- 127* 104*  BUN 8 -- 10 10  CREATININE 0.79 0.80 0.96 0.89  CALCIUM 8.5 -- 8.6 8.8  MG -- -- 2.0 --  PHOS -- -- -- --   Liver Function Tests:  Lab 01/14/12 0245  AST 23  ALT 16  ALKPHOS 80  BILITOT 0.2*  PROT 8.3  ALBUMIN 2.7*   CBC:  Lab 01/16/12 0610 01/15/12 1456 01/15/12 0500 01/14/12 0245 01/13/12 1629  WBC 6.3 5.7 7.1 6.1 5.9  NEUTROABS -- -- -- -- --  HGB 11.6* 12.8* 12.0* 12.0* 12.7*  HCT 36.2* 39.1 37.2* 36.6* 39.4  MCV 88.9 88.9 89.2 88.2 89.1  PLT 453* 449* 499* 467* 495*     Recent Results (from the past 240 hour(s))  CULTURE, BLOOD (ROUTINE X 2)     Status: Normal (Preliminary result)   Collection Time   01/14/12  1:50 AM      Component Value Range Status Comment   Specimen Description BLOOD RIGHT ARM   Final    Special Requests BOTTLES DRAWN AEROBIC AND ANAEROBIC 10CC EA   Final     Culture  Setup Time 01/14/2012 14:18   Final    Culture     Final    Value:        BLOOD CULTURE RECEIVED NO GROWTH TO DATE CULTURE WILL BE HELD FOR 5 DAYS BEFORE ISSUING A FINAL NEGATIVE REPORT   Report Status PENDING   Incomplete   CULTURE, BLOOD (ROUTINE X 2)     Status: Normal (Preliminary result)   Collection Time   01/14/12  1:55 AM      Component Value Range Status Comment   Specimen Description BLOOD RIGHT HAND   Final    Special Requests BOTTLES DRAWN AEROBIC AND ANAEROBIC 10CC EA   Final    Culture  Setup  Time 01/14/2012 14:19   Final    Culture     Final    Value:        BLOOD CULTURE RECEIVED NO GROWTH TO DATE CULTURE WILL BE HELD FOR 5 DAYS BEFORE ISSUING A FINAL NEGATIVE REPORT   Report Status PENDING   Incomplete   CULTURE, BLOOD (ROUTINE X 2)     Status: Normal (Preliminary result)   Collection Time   01/14/12 12:48 PM      Component Value Range Status Comment   Specimen Description BLOOD RIGHT HAND   Final    Special Requests BOTTLES DRAWN AEROBIC ONLY 10CC   Final    Culture  Setup Time 01/14/2012 21:35   Final    Culture     Final    Value:        BLOOD CULTURE RECEIVED NO GROWTH TO DATE CULTURE WILL BE HELD FOR 5 DAYS BEFORE ISSUING A FINAL NEGATIVE REPORT   Report Status PENDING   Incomplete   CULTURE, BLOOD (ROUTINE X 2)     Status: Normal (Preliminary result)   Collection Time   01/14/12 12:51 PM      Component Value Range Status Comment   Specimen Description BLOOD RIGHT ARM   Final    Special Requests BOTTLES DRAWN AEROBIC AND ANAEROBIC 10CC   Final    Culture  Setup Time 01/14/2012 21:35   Final    Culture     Final    Value:        BLOOD CULTURE RECEIVED NO GROWTH TO DATE CULTURE WILL BE HELD FOR 5 DAYS BEFORE ISSUING A FINAL NEGATIVE REPORT   Report Status PENDING   Incomplete      Studies:  Arteriogram 01/15/12  FINDING(S):  Widely patent Type II aortic arch  Widely patent innominate , left common carotid , and left subclavian arteries  Patent  bilateral vertebral arteries  Patent right subclavian, common carotid, axillary, brachial, radial and ulnar arteries  Diseased right digital arteries with partial occlusion in second and third fingers  Patent left subclavian, axillary, brachial, radial and ulnar arteries  Diseased left digital arteries with partial occlusion in second and third fingers (worse than right side)    No results found.  Scheduled Meds:    . aspirin  81 mg Oral Daily  . chlordiazePOXIDE  25 mg Oral QID   Followed by  . chlordiazePOXIDE  25 mg Oral TID   Followed by  . chlordiazePOXIDE  25 mg Oral BH-qamhs   Followed by  . chlordiazePOXIDE  25 mg Oral Daily  . chlordiazePOXIDE  50 mg Oral Once  . enoxaparin  40 mg Subcutaneous Q24H  . gabapentin  300 mg Oral BID  . gabapentin  300 mg Oral TID  . multivitamin with minerals  1 tablet Oral Daily  . sodium chloride  3 mL Intravenous Q12H  . thiamine  100 mg Intramuscular Once  . thiamine  100 mg Oral Daily   Continuous Infusions:    . sodium chloride 20 mL/hr (01/16/12 0255)     Stephani Police  [facility] Pager [phone].  If 7PM-7AM, please contact night-coverage at [email or web address], password [facility] 01/17/2012, 3:24 PM  LOS: 4 days

## 2012-01-18 LAB — BASIC METABOLIC PANEL
BUN: 11 mg/dL (ref 6–23)
CO2: 29 mEq/L (ref 19–32)
Chloride: 97 mEq/L (ref 96–112)
GFR calc Af Amer: >90 mL/min (ref >90)
Glucose, Bld: 97 mg/dL (ref 70–99)
Potassium: 4.4 mEq/L (ref 3.5–5.1)

## 2012-01-18 LAB — CBC
HCT: 36 % — ABNORMAL LOW (ref 39.0–52.0)
Hemoglobin: 11.8 g/dL — ABNORMAL LOW (ref 13.0–17.0)
MCHC: 32.8 g/dL (ref 30.0–36.0)
MCV: 88.5 fL (ref 78.0–100.0)

## 2012-01-18 MED ORDER — ASPIRIN 81 MG PO CHEW
324.0000 mg | CHEWABLE_TABLET | Freq: Every day | ORAL | Status: DC
  Administered 2012-01-19: 324 mg via ORAL
  Filled 2012-01-18: qty 4

## 2012-01-18 MED ORDER — NITROGLYCERIN 0.1 MG/HR TD PT24
0.1000 mg | MEDICATED_PATCH | Freq: Every day | TRANSDERMAL | Status: DC
  Administered 2012-01-18 – 2012-01-19 (×2): 0.1 mg via TRANSDERMAL
  Filled 2012-01-18 (×2): qty 1

## 2012-01-18 MED ORDER — PREDNISONE 50 MG PO TABS
50.0000 mg | ORAL_TABLET | Freq: Every day | ORAL | Status: DC
  Administered 2012-01-19: 50 mg via ORAL
  Filled 2012-01-18 (×3): qty 1

## 2012-01-18 MED ORDER — METHYLPREDNISOLONE SODIUM SUCC 125 MG IJ SOLR
125.0000 mg | Freq: Once | INTRAMUSCULAR | Status: AC
  Administered 2012-01-18: 125 mg via INTRAVENOUS
  Filled 2012-01-18: qty 2

## 2012-01-18 NOTE — Progress Notes (Signed)
Patient ID: Sean [name], male   DOB: 05-Mar-1961, 51 y.o.   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  Splinter hemorrhages were noted particularly in the nail bed of his 2nd and 3rd digits bilaterally.  Blackened areas were noted at the distal end of these same digits.  The patient complained of an acute episode of generalized joint pain so severe he could not walk. He was admitted to the hospital.  Vascular was consulted and performed an arteriogram that showed digital ischemia in these same digits.  They suggested using nitro glycerin patch and Hand Surgery consultation.    Hand surgery was consulted and originally felt surgery was not appropriate at this time.  They recommended aspirin and gabapentin.  Dr. Mina Marble has now obtained a copy of the arteriogram and will review it to determine if the patient might benefit from digital sympathectomy or transfer for grafting [facility] - Dr. Charolett Bumpers).  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  Dr. [name] ordered further serologies which demonstrate a positive ENA (ro), and positive Hep C ab.  Rheumatology will revisit this evening.  Serum cryoglobulin and HCV RNA Geno-type are pending.  A dose of Solumedrol 125 mg IV has been given.  01/18/2012.  The patient is complaining of increased joint pain that started 10/16 - just like his episode that brought him to the ED - as well as weakness in his upper extremities bilaterally.  He is unable to lift one arm without assistance from the other.  The discoloration of his fingers has progressed.  Ex. His complete left 5th digit is a medium shade of grade this morning.  The pain in the tips of his ischemic fingers has become more severe today.  Further over the last 36 hours the patient has intermittently had low grade fever and  tachycardia.  At this point we feel the patient's symptoms are worsening and we are considering transfer to a tertiary care center [facility] for the patient to receive specialized care.  We are very appreciative of Rheumatology, Hand Surgery and Vascular's recommendations.   Substance Abuse -Urine Drug screen is positive for cocaine and benzodiazepines. Patient admitted to social work that he drinks alcohol excessively as well.  Social work has seen the patient an offered resources.  Patient refused inpatient rehab as he has to care for his mother and brother at hom.  Tachycardia and low grade fever.  over night 01/16/2012 patient developed low grade fever, agitation, and tachycardia.  We are uncertain if this is related to his acute issue or if it may be associated with alcohol withdraw.  He has been started on a librium protocol for possible ETOH withdraw.  Code Status: Full Family Communication: Patient  Disposition Plan: To be determined  Consultants:  Vascular surgery, Dr. Tilman Neat Surgery, Dr. Mina Marble  Rheumatology, Dr. [name]  Procedures:  Angiogram 01/15/2012  Antibiotics:  None (indicate start date, and stop date if known)  HPI/Subjective: Complaining of progressive weakening in his shoulders and continued generalized pain in his joints.  Further, the pain in his finger tips has become more severe.  Objective: Filed Vitals:   01/18/12 0546 01/18/12 0605 01/18/12 1200 01/18/12 1430  BP: 137/86 135/86 115/74 135/85  Pulse: 113 102 98 100  Temp: 99.1 F (37.3 C)   97.9 F (36.6 C)  TempSrc: Oral   Oral  Resp: 18   20  Height:      Weight:      SpO2: 98%   93%    Intake/Output Summary (Last 24 hours) at 01/18/12 1630 Last data filed at 01/18/12 1300  Gross per 24 hour  Intake   1380 ml  Output      0 ml  Net   1380 ml   Filed Weights   01/13/12 1814 01/13/12 2325  Weight: 63.504 kg (140 lb) 58.7 kg (129 lb 6.6 oz)    Exam:  General: Alert,  awake, oriented.   HEENT: No bruits, no goiter. He has cavities in his teeth, he has a splinter hemorrhage on his left inner cheek.  Temples appear sunken. Heart: Slightly tachy, Regular rhythm, systolic murmur is appreciated in the tricuspid area no rubs, gallops.  Lungs: Good air movement, clear to auscultation.  No wheeze, crackles, or rals Abdomen: Soft, nontender, nondistended, positive bowel sounds.  Neuro: Grossly intact, nonfocal. Extremities: Upper ext.  Fingers appear to have worsening discoloration.  The necrosis of digits 2 and 3 appears to be spreading downward and his little finger (left 5th digit) has become complete GREY.     Data Reviewed:  Results for Sean [name], Sean [name] (MRN [id number]) as of 01/17/2012 15:34  Ref. Range 01/16/2012 18:15  Sed Rate Latest Range: 0-16 mm/hr 73 (H)  Anticardiolipin IgA Latest Range: <22 APL U/mL 10 (L)  Anticardiolipin IgG Latest Range: <23 GPL U/mL 11 (L)  Anticardiolipin IgM Latest Range: <11 MPL U/mL 5 (L)  PTT Lupus Anticoagulant Latest Range: 28.0-43.0 secs 43.0  PTTLA Confirmation Latest Range: <8.0 secs NOT APPL  PTTLA 4:1 Mix Latest Range: 28.0-43.0 secs 42.4  DRVVT Latest Range: <42.9 secs 40.7  Drvvt confirmation Latest Range: <1.11 Ratio NOT APPL  dRVVT Incubated 1:1 Mix Latest Range: <42.9 secs NOT APPL  Lupus Anticoagulant Latest Range: NOT DETECTED  NOT DETECTED  Beta-2 Glyco I IgG Latest Range: <20 G Units 0  Beta-2-Glycoprotein I IgA Latest Range: <20 A Units 1  Beta-2-Glycoprotein I IgM Latest Range: <20 M Units 16  ds DNA Ab Latest Range: <30 IU/mL 39 (H)  ENA Latest Range: <30 AU/mL 1  SSA (Ro) (ENA) Antibody, IgG Latest Range: <30 AU/mL 124 (H)  SSB (La) (ENA) Antibody, IgG Latest Range: <30 AU/mL <1  Scleroderma (Scl-70) (ENA) Antibody, IgG Latest Range: <30 AU/mL <1  SM/RNP Latest Range: <30 AU/mL <1  Hep A IgM Latest Range: NEGATIVE  NEGATIVE  Hepatitis B Surface Ag Latest Range: NEGATIVE  NEGATIVE  Hep B C  IgM Latest Range: NEGATIVE  NEGATIVE  HCV Ab Latest Range: NEGATIVE  Reactive (A)  HIV Latest Range: NON REACTIVE  NON REACTIVE   Basic Metabolic Panel:  Lab 01/18/12 1308 01/16/12 0610 01/15/12 1456 01/14/12 0245 01/13/12 2257  NA 133* 133* -- 133* 135  K 4.4 3.8 -- 4.1 3.9  CL 97 99 -- 101 100  CO2 29 27 -- 23 25  GLUCOSE 97 140* -- 127* 104*  BUN 11 8 -- 10 10  CREATININE 0.85 0.79 0.80 0.96 0.89  CALCIUM 8.7 8.5 -- 8.6 8.8  MG -- -- -- 2.0 --  PHOS -- -- -- -- --   Liver Function Tests:  Lab 01/14/12 0245  AST 23  ALT 16  ALKPHOS 80  BILITOT 0.2*  PROT 8.3  ALBUMIN 2.7*   CBC:  Lab 01/18/12 0954 01/16/12 0610 01/15/12 1456 01/15/12 0500 01/14/12 0245  WBC 6.3 6.3 5.7 7.1 6.1  NEUTROABS -- -- -- -- --  HGB 11.8* 11.6* 12.8* 12.0* 12.0*  HCT 36.0* 36.2* 39.1 37.2* 36.6*  MCV 88.5 88.9 88.9 89.2 88.2  PLT 518* 453* 449* 499* 467*     Recent Results (from the past 240 hour(s))  CULTURE, BLOOD (ROUTINE X 2)     Status: Normal (Preliminary result)   Collection Time   01/14/12  1:50 AM      Component Value Range Status Comment   Specimen Description BLOOD RIGHT ARM   Final    Special Requests BOTTLES DRAWN AEROBIC AND ANAEROBIC 10CC EA   Final    Culture  Setup Time 01/14/2012 14:18   Final    Culture     Final    Value:        BLOOD CULTURE RECEIVED NO GROWTH TO DATE CULTURE WILL BE HELD FOR 5 DAYS BEFORE ISSUING A FINAL NEGATIVE REPORT   Report Status PENDING   Incomplete   CULTURE, BLOOD (ROUTINE X 2)     Status: Normal (Preliminary result)   Collection Time   01/14/12  1:55 AM      Component Value Range Status Comment   Specimen Description BLOOD RIGHT HAND   Final    Special Requests BOTTLES DRAWN AEROBIC AND ANAEROBIC 10CC EA   Final    Culture  Setup Time 01/14/2012 14:19   Final    Culture     Final    Value:        BLOOD CULTURE RECEIVED NO GROWTH TO DATE CULTURE WILL BE HELD FOR 5 DAYS BEFORE ISSUING A FINAL NEGATIVE REPORT   Report Status PENDING    Incomplete   CULTURE, BLOOD (ROUTINE X 2)     Status: Normal (Preliminary result)   Collection Time   01/14/12 12:48 PM      Component Value Range Status Comment   Specimen Description BLOOD RIGHT HAND   Final    Special Requests BOTTLES DRAWN AEROBIC ONLY 10CC   Final    Culture  Setup Time 01/14/2012 21:35   Final    Culture     Final    Value:        BLOOD CULTURE RECEIVED NO GROWTH TO DATE CULTURE WILL BE HELD FOR 5 DAYS BEFORE ISSUING A FINAL NEGATIVE REPORT   Report Status PENDING   Incomplete   CULTURE, BLOOD (ROUTINE X 2)     Status: Normal (Preliminary result)   Collection Time   01/14/12 12:51 PM      Component Value Range Status Comment   Specimen Description BLOOD RIGHT ARM   Final    Special Requests BOTTLES DRAWN AEROBIC AND ANAEROBIC 10CC   Final    Culture  Setup Time 01/14/2012 21:35   Final    Culture     Final    Value:        BLOOD CULTURE RECEIVED NO GROWTH TO DATE CULTURE WILL BE HELD FOR 5 DAYS BEFORE ISSUING A FINAL NEGATIVE REPORT   Report Status PENDING   Incomplete      Studies:  Arteriogram 01/15/12  FINDING(S):  Widely patent Type II aortic arch  Widely patent innominate , left common carotid , and left subclavian arteries  Patent bilateral vertebral arteries  Patent right subclavian, common carotid, axillary, brachial, radial and ulnar arteries  Diseased right digital arteries with partial occlusion in second and third fingers  Patent left subclavian, axillary, brachial, radial and ulnar arteries  Diseased left digital arteries with partial occlusion in  second and third fingers (worse than right side)    No results found.  Scheduled Meds:    . aspirin  324 mg Oral Daily  . chlordiazePOXIDE  25 mg Oral QID   Followed by  . chlordiazePOXIDE  25 mg Oral TID   Followed by  . chlordiazePOXIDE  25 mg Oral BH-qamhs   Followed by  . chlordiazePOXIDE  25 mg Oral Daily  . enoxaparin  40 mg Subcutaneous Q24H  . gabapentin  300 mg Oral BID  .  gabapentin  300 mg Oral TID  . methylPREDNISolone (SOLU-MEDROL) injection  125 mg Intravenous Once  . multivitamin with minerals  1 tablet Oral Daily  . nitroGLYCERIN  0.1 mg Transdermal Daily  . sodium chloride  3 mL Intravenous Q12H  . thiamine  100 mg Oral Daily  . DISCONTD: aspirin  81 mg Oral Daily   Continuous Infusions:    . sodium chloride 20 mL/hr (01/16/12 0255)     Stephani Police  [facility] Pager [phone].  If 7PM-7AM, please contact night-coverage at [email or web address], password [facility] 01/18/2012, 4:30 PM  LOS: 5 days

## 2012-01-18 NOTE — Progress Notes (Signed)
VASCULAR PROGRESS NOTE  Asked by the medical service to reevaluate ischemic index fingers. He was seen in consultation by Dr. Myra Gianotti on 01/14/2012. At that time he had bilateral radial and ulnar pulses. He underwent an arteriogram in order to look for a source of embolization. The arteriogram was performed on 01/15/2012 and showed widely patent upper extremity arteries from the aortic arch all the way to the hand. He had some distal digital disease, but overall had excellent circulation to both hands. He is undergoing a rheumatologic workup. He has also been seen by Dr. Mina Marble. He was to follow up with Dr. Mina Marble in the office once he was discharged. He was having continued pain in the index fingers and vascular surgery was asked to reevaluate him.  SUBJECTIVE: He complains of pain in both knees and also pain at the tips of both index fingers.  PHYSICAL EXAM: Filed Vitals:   01/17/12 2108 01/17/12 2300 01/18/12 0546 01/18/12 0605  BP: 137/100 130/90 137/86 135/86  Pulse: 114 98 113 102  Temp: 98 F (36.7 C)  99.1 F (37.3 C)   TempSrc: Oral  Oral   Resp: 20  18   Height:      Weight:      SpO2: 96%  98%    He has palpable brachial, radial, and ulnar pulses bilaterally. He has darkish discoloration of the very tips of both index fingers. The hands are otherwise warm and well perfused.  LABS: Lab Results  Component Value Date   WBC 6.3 01/18/2012   HGB 11.8* 01/18/2012   HCT 36.0* 01/18/2012   MCV 88.5 01/18/2012   PLT 518* 01/18/2012   ASSESSMENT/PLAN: This patient has persistent discoloration of the tips of both index fingers. He has no evidence of significant occlusive disease on exam or by arteriography. He is undergoing a rheumatologic workup for vasculitis. I do not think the history is consistent with vasospastic disease. There is been no trauma to the hands that he is aware of. There is nothing further to offer from a vascular surgery standpoint. He is currently on  aspirin. If his pain persists then perhaps the hand surgeons would be able to offer something such as digital sympathectomy. He does plan to follow up with a hand surgeons once he is discharged. We will be available as needed.  Waverly Ferrari, MD, FACS Beeper: [phone] 01/18/2012

## 2012-01-18 NOTE — Progress Notes (Signed)
Pt states that his joints ache after taking librium.

## 2012-01-18 NOTE — Progress Notes (Signed)
Subjective Since I last saw patient, he has had some worsening of his joint pain and apparently had a episode of delirium either last last or earlier this morning with some agitation. He tells me he had worsening ankle pain which made it difficult to walk. He has also developed interval discoloration of the tip of his left 5th finger.  Objective: Vital signs in last 24 hours: Temp:  [97.9 F (36.6 C)-99.1 F (37.3 C)] 97.9 F (36.6 C) (10/17 1430) Pulse Rate:  [98-114] 100  (10/17 1430) Resp:  [18-20] 20  (10/17 1430) BP: (115-137)/(74-100) 135/85 mmHg (10/17 1430) SpO2:  [93 %-98 %] 93 % (10/17 1430) Weight change:  Last BM Date: 01/16/12  Intake/Output from previous day: 10/16 0701 - 10/17 0700 In: 900 [P.O.:900] Out: -  Intake/Output this shift: Total I/O In: 480 [P.O.:480] Out: -   General appearance: alert Gen: resting quietly in bed. Vitals reviewed Psych: initially seemed somewhat confused about time, but subsequently was appropriate and seemed oriented. Skin: expansion of the petechial lesions of the right index finger. New confluent cyanosis over the left 5th finger. No overt ulcerations. Msk: continued swelling of the wrists bilaterally with some swelling of the mcps.  Ankles appear normal as do the knees.  Lab Results:  [facility] 01/18/12 0954 01/16/12 0610  WBC 6.3 6.3  HGB 11.8* 11.6*  HCT 36.0* 36.2*  PLT 518* 453*   BMET  Basename 01/18/12 0954 01/16/12 0610  NA 133* 133*  K 4.4 3.8  CL 97 99  CO2 29 27  GLUCOSE 97 140*  BUN 11 8  CREATININE 0.85 0.79  CALCIUM 8.7 8.5    Studies/Results: No results found.  Medications: I have reviewed the patient's current medications.  Assessment/Plan: 1. 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 Differential includes levamisole-related vasculitis (though patient notes he has had no drug exposure since admission and lesions are  worsening), ANCA-negative idiopathic vasculitis, SSA positive lupus (not Sjogren's), or perhaps Hep C related vasculitis.   He feels some better after getting the IV steroids.  Given the advancing necrotic lesions in his fingers, we are compelled to be aggressive. Review of the record, shows he had an inflammatory massed reviewed from his elbow last year. Pathology simply show nonspecific inflammation without other defining features. Unsure if this is related.  Plan:  1. Will infuse again this afternoon with Solumedrol and begin 60mg  of prednisone daily tomorrow.  More definitive treatment with hydroxychloroquine/methotrexate/ or azathioprine can be decide outpatient.  If not already ordered, his Hep C viral load needs to be determined as well as cryoglobulins, however given the low level of RF he has, this is likely normal. I would recommend continuing aspirin even though his APS antibodies were negative.  2. I will see him next week in clinic ([phone]), but assist in helping him see Dr. Allena Katz, Rheumatologist, at the [facility] clinic in [place].  LOS: 5 days   Azzie Roup 01/18/2012, 5:11 PM

## 2012-01-18 NOTE — Progress Notes (Signed)
Addendum  Patient seen and examined, chart and data base reviewed.  I agree with the above assessment and plan.  For full details please see Mrs. Algis Downs PA. Note.  Rheumatology to evaluate, waiting for Dr. [name].   Clint Lipps, MD [facility] Pager: [phone] 01/18/2012, 4:42 PM

## 2012-01-19 MED ORDER — PREDNISONE 20 MG PO TABS: 60.0000 mg | ORAL_TABLET | Freq: Every day | ORAL | Status: DC

## 2012-01-19 MED ORDER — NIFEDIPINE ER 30 MG PO TB24: 30.0000 mg | ORAL_TABLET | Freq: Every day | ORAL | Status: DC

## 2012-01-19 MED ORDER — ASPIRIN EC 325 MG PO TBEC: 325.0000 mg | DELAYED_RELEASE_TABLET | Freq: Every day | ORAL | Status: AC

## 2012-01-19 MED ORDER — OXYCODONE-ACETAMINOPHEN 10-325 MG PO TABS: 1.0000 | ORAL_TABLET | Freq: Four times a day (QID) | ORAL | Status: DC | PRN

## 2012-01-19 NOTE — Progress Notes (Signed)
Patient ID: BAZE BEEVERS, male   DOB: Jun 27, 1960, 51 y.o.   MRN: [id number] Have reviewed arteriogram from Dr. 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

## 2012-01-19 NOTE — Progress Notes (Signed)
1840 Medication given to patient from pharmacy as ordered patient verbalize and understand.

## 2012-01-19 NOTE — Discharge Summary (Signed)
Physician Discharge Summary  Sean [name]:[id number] DOB: [birth year] DOA: 01/13/2012  PCP: Provider Not In System  Admit date: 01/13/2012 Discharge date: 01/19/2012  Recommendations for Outpatient Follow-up:  1. Followup with primary care physician 1-2 weeks. 2. Followup with [name] in 7-10 days 3. Follow up with Dr. [name] in 1 week, at discharge hep C viral load and cryoglobulins were pending  Discharge Diagnoses:  Principal Problem:  *Ischemic finger Active Problems:  Polysubstance abuse  Low grade fever   Discharge Condition: Stable  Diet recommendation: Regular diet  Filed Weights   01/13/12 1814 01/13/12 2325  Weight: 63.504 kg (140 lb) 58.7 kg (129 lb 6.6 oz)    History of present illness:  This is a healthy 51 year old gentleman who states that approximately 2 weeks ago he developed pain in the bilateral index fingers, pain was severe enough to provide sleep. He states the fingers were changing colors and meaning becoming a bluish hue. Pain has continued to be severe to today. He was in here 2 days ago with the same complaint, blood work and x-rays that were negative he was discharged home. He came by to see her last night, her ultrasound was done revealing decreased flow. 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 His back to the ER today. Admission the patient complains of generalized aches and pains severe in the back bending on the legs, severe enough to bring him from going to work for the past 2 weeks. He states this is also new. He denies any fevers or chills. He states he been having some episodes of nausea vomiting.   Hospital Course:    1. Ischemic fingers: Patient initially admitted with acute pain and swelling of his hands bilaterally. Splinter hemorrhages were noted particular teacher in the nailbeds of the second and third digits bilaterally.  Lack of dysuria were noted at the distal end of same date. Patient also is complaining about generalized joint pains including shoulders knees and ankles. Vascular surgery was consulted arteriogram was done on 01/15/2012 and showed partial occlusion of the digital arteries. Vascular recommended hand surgery consultation and there is no particular lesion can be reconstructed. [name] of hand surgery was consulted and he recommended aspirin, he obtained a copy of the arteriogram and reviewed it. Dr. 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 Although the patient does not have history of cigarette smoking. Nifedipine 30 mg and prednisone were recommended. These are prescribed at the time of discharge. Patient to followup with Dr. 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 Please note that Dr. [name] of rheumatology was also consulted, patient has positive hepatitis C antibodies, Dr. [name] recommended 60 mg of prednisone daily. The patient should followup with him. As this might be hepatitis C related vasculitis. The main differential diagnoses for now as Buerger disease versus hepatitis C-related vasculitis. Patient discharged home on nifedipine and prednisone.  2. Polysubstance abuse: Including cocaine, benzodiazepines and alcohol. Patient started cocaine, there is no IV use. There was concern about levimasole related vasculitis. Patient did not have any symptoms or signs suggesting withdrawal while is in the hospital, he was considered for alcohol withdrawal but it was not withdrawn  Procedures:  Arteriogram showed FINDING(S):  Widely patent Type II aortic arch  Widely patent innominate , left common carotid , and left subclavian arteries  Patent bilateral vertebral  arteries  Patent right subclavian, common carotid, axillary, brachial, radial and ulnar arteries  Diseased right digital arteries with partial occlusion in second and third fingers  Patent left subclavian, axillary, brachial, radial and ulnar arteries  Diseased left digital arteries with partial occlusion in second and third fingers (worse than right side)  Consultations:  Nada Libman of vascular surgery.  [name] of hand surgery.  [name] of rheumatology   Discharge Exam: Filed Vitals:   01/18/12 2054 01/19/12 0000 01/19/12 0527 01/19/12 1426  BP: 128/81 135/79 128/87 140/97  Pulse: 87 84 81 99  Temp: 97.5 F (36.4 C)  97.4 F (36.3 C) 97.8 F (36.6 C)  TempSrc: Oral   Oral  Resp: 18  18 18   Height:      Weight:      SpO2: 96%  94% 99%   General: Alert and awake, oriented x3, not in any acute distress. HEENT: anicteric sclera, pupils reactive to light and accommodation, EOMI CVS: S1-S2 clear, no murmur rubs or gallops Chest: clear to auscultation bilaterally, no wheezing, rales or rhonchi Abdomen: soft nontender, nondistended, normal bowel sounds, no organomegaly Extremities: no cyanosis, clubbing or edema noted bilaterally Neuro: Cranial nerves II-XII intact, no focal neurological deficits   Discharge Instructions  Discharge Orders    Future Orders Please Complete By Expires   Increase activity slowly          Medication List     As of 01/19/2012  4:56 PM    STOP taking these medications         BC HEADACHE POWDER PO      TAKE these medications         aspirin EC 325 MG tablet   Take 1 tablet (325 mg total) by mouth daily.      ibuprofen 800 MG tablet   Commonly known as: ADVIL,MOTRIN   Take 800 mg by mouth every 8 (eight) hours as needed. For pain      NIFEdipine 30 MG 24 hr tablet   Commonly known as: PROCARDIA-XL/ADALAT CC   Take 1 tablet (30 mg total) by mouth daily.      oxyCODONE-acetaminophen 10-325 MG per tablet    Commonly known as: PERCOCET   Take 1 tablet by mouth every 6 (six) hours as needed for pain.      predniSONE 20 MG tablet   Commonly known as: DELTASONE   Take 3 tablets (60 mg total) by mouth daily.           Follow-up Information    Call [facility]. (To schedule an assessment for outpatient treatment options)    Contact information:   [address], [place] [phone]      Please follow up. (Call and schedule intake if you decide you want residential treatment)       Follow up with [facility], MD. Schedule an appointment as soon as possible for a visit in 1 week. (Follow up as directed by Dr. Mina Marble)    Contact information:   [address] [place] [address] [phone]       Follow up with [facility], MD. On 02/09/2012. (2:15 , bring medications, id , and initial visit is $50)    Contact information:   [address] [place] [address] [phone]  Follow up with Azzie Roup, MD. In 1 week. (Rheumatology. Will call you with appointment if necessary.)    Contact information:   [address] [place] [address] [phone]           The results of significant diagnostics from this hospitalization (including imaging, microbiology, ancillary and laboratory) are listed below for reference.    Significant Diagnostic Studies: Dg Chest 2 View  01/14/2012  *RADIOLOGY REPORT*  Clinical Data: Fever  CHEST - 2 VIEW  Comparison: 06/06/2010  Findings: Lungs are essentially clear. No pleural effusion or pneumothorax.  Cardiomediastinal silhouette is within normal limits.  Mild degenerative changes of the visualized thoracolumbar spine.  IMPRESSION: No evidence of acute cardiopulmonary disease.   Original Report Authenticated By: Charline Bills, M.D.    Dg Hand Complete Left  01/14/2012  *RADIOLOGY REPORT*  Clinical Data: Pain and swelling in the hands bilaterally.  LEFT HAND -  COMPLETE 3+ VIEW  Comparison: No priors.  Findings: Three views of the left hand demonstrate no acute fracture, subluxation or dislocation.  There is mild joint space narrowing, subchondral sclerosis, subchondral cyst formation and osteophyte formation in the DIP joints and first MCP joint, compatible with mild osteoarthritis.  IMPRESSION: 1.  No acute radiographic abnormality of the left hand. 2.  Changes of mild osteoarthritis, as above.   Original Report Authenticated By: Florencia Reasons, M.D.    Dg Hand Complete Right  01/14/2012  *RADIOLOGY REPORT*  Clinical Data: Pain and swelling in the hands bilaterally.  RIGHT HAND - COMPLETE 3+ VIEW  Comparison: No priors.  Findings: The three views of the right hand demonstrate no definite acute displaced fracture, subluxation or dislocation.  There is a well corticated fragment adjacent to the ulnar styloid, compatible with an old ulnar styloid avulsion fracture.  Multifocal mild joint space narrowing, subchondral sclerosis, subchondral cyst formation and osteophyte formation is noted, most pronounced in the DIP joints and the first MCP joint, compatible with mild osteoarthritis.  IMPRESSION: 1.  No acute radiographic abnormality of the right hand. 2.  Mild osteoarthritis, as above. 3.  Old ulnar styloid avulsion fracture, incidentally noted.   Original Report Authenticated By: Florencia Reasons, M.D.    Dg Finger Index Right  01/09/2012  *RADIOLOGY REPORT*  Clinical Data: Pain  RIGHT INDEX FINGER 2+V  Comparison: None.  Findings:  Frontal, oblique, and lateral views were obtained.  No fracture or dislocation.  Joint spaces appear intact.  No erosive change.  IMPRESSION:  No abnormality noted.   Original Report Authenticated By: Arvin Collard. WOODRUFF III, M.D.     Microbiology: Recent Results (from the past 240 hour(s))  CULTURE, BLOOD (ROUTINE X 2)     Status: Normal (Preliminary result)   Collection Time   01/14/12  1:50 AM      Component Value Range  Status Comment   Specimen Description BLOOD RIGHT ARM   Final    Special Requests BOTTLES DRAWN AEROBIC AND ANAEROBIC 10CC EA   Final    Culture  Setup Time 01/14/2012 14:18   Final    Culture     Final    Value:        BLOOD CULTURE RECEIVED NO GROWTH TO DATE CULTURE WILL BE HELD FOR 5 DAYS BEFORE ISSUING A FINAL NEGATIVE REPORT   Report Status PENDING   Incomplete   CULTURE, BLOOD (ROUTINE X 2)     Status: Normal (Preliminary result)   Collection Time   01/14/12  1:55 AM  Component Value Range Status Comment   Specimen Description BLOOD RIGHT HAND   Final    Special Requests BOTTLES DRAWN AEROBIC AND ANAEROBIC 10CC EA   Final    Culture  Setup Time 01/14/2012 14:19   Final    Culture     Final    Value:        BLOOD CULTURE RECEIVED NO GROWTH TO DATE CULTURE WILL BE HELD FOR 5 DAYS BEFORE ISSUING A FINAL NEGATIVE REPORT   Report Status PENDING   Incomplete   CULTURE, BLOOD (ROUTINE X 2)     Status: Normal (Preliminary result)   Collection Time   01/14/12 12:48 PM      Component Value Range Status Comment   Specimen Description BLOOD RIGHT HAND   Final    Special Requests BOTTLES DRAWN AEROBIC ONLY 10CC   Final    Culture  Setup Time 01/14/2012 21:35   Final    Culture     Final    Value:        BLOOD CULTURE RECEIVED NO GROWTH TO DATE CULTURE WILL BE HELD FOR 5 DAYS BEFORE ISSUING A FINAL NEGATIVE REPORT   Report Status PENDING   Incomplete   CULTURE, BLOOD (ROUTINE X 2)     Status: Normal (Preliminary result)   Collection Time   01/14/12 12:51 PM      Component Value Range Status Comment   Specimen Description BLOOD RIGHT ARM   Final    Special Requests BOTTLES DRAWN AEROBIC AND ANAEROBIC 10CC   Final    Culture  Setup Time 01/14/2012 21:35   Final    Culture     Final    Value:        BLOOD CULTURE RECEIVED NO GROWTH TO DATE CULTURE WILL BE HELD FOR 5 DAYS BEFORE ISSUING A FINAL NEGATIVE REPORT   Report Status PENDING   Incomplete      Labs: Basic Metabolic  Panel:  Lab 01/18/12 0954 01/16/12 0610 01/15/12 1456 01/14/12 0245 01/13/12 2257  NA 133* 133* -- 133* 135  K 4.4 3.8 -- 4.1 3.9  CL 97 99 -- 101 100  CO2 29 27 -- 23 25  GLUCOSE 97 140* -- 127* 104*  BUN 11 8 -- 10 10  CREATININE 0.85 0.79 0.80 0.96 0.89  CALCIUM 8.7 8.5 -- 8.6 8.8  MG -- -- -- 2.0 --  PHOS -- -- -- -- --   Liver Function Tests:  Lab 01/14/12 0245  AST 23  ALT 16  ALKPHOS 80  BILITOT 0.2*  PROT 8.3  ALBUMIN 2.7*   No results found for this basename: LIPASE:5,AMYLASE:5 in the last 168 hours No results found for this basename: AMMONIA:5 in the last 168 hours CBC:  Lab 01/18/12 0954 01/16/12 0610 01/15/12 1456 01/15/12 0500 01/14/12 0245  WBC 6.3 6.3 5.7 7.1 6.1  NEUTROABS -- -- -- -- --  HGB 11.8* 11.6* 12.8* 12.0* 12.0*  HCT 36.0* 36.2* 39.1 37.2* 36.6*  MCV 88.5 88.9 88.9 89.2 88.2  PLT 518* 453* 449* 499* 467*   Cardiac Enzymes: No results found for this basename: CKTOTAL:5,CKMB:5,CKMBINDEX:5,TROPONINI:5 in the last 168 hours BNP: BNP (last 3 results) No results found for this basename: PROBNP:3 in the last 8760 hours CBG: No results found for this basename: GLUCAP:5 in the last 168 hours  Time coordinating discharge: 40 minutes  Signed:  Tatem Holsonback A  [facility] 01/19/2012, 4:56 PM

## 2012-01-19 NOTE — Progress Notes (Signed)
1715 Patient personal belonging returned to patient 1 driver license, 2 credit cards, 1 pocket knife  Also discharge instructions reviewed with patient verbalize and understand . Prescriptions given to patient

## 2012-01-20 LAB — CULTURE, BLOOD (ROUTINE X 2): Culture: NO GROWTH

## 2012-01-22 LAB — HCV RNA QUANT RFLX ULTRA OR GENOTYP: HCV Quantitative Log: 3.32 {Log} — ABNORMAL HIGH (ref <1.18)

## 2012-01-23 ENCOUNTER — Encounter (HOSPITAL_COMMUNITY): Payer: Self-pay | Admitting: Cardiology

## 2012-01-23 ENCOUNTER — Emergency Department (HOSPITAL_COMMUNITY)
Admission: EM | Admit: 2012-01-23 | Discharge: 2012-01-23 | Disposition: A | Discharge status: Home / Self Care | Payer: Self-pay | Attending: Emergency Medicine | Admitting: Emergency Medicine

## 2012-01-23 DIAGNOSIS — Z7982 Long term (current) use of aspirin: Secondary | ICD-10-CM | POA: Insufficient documentation

## 2012-01-23 DIAGNOSIS — M25849 Other specified joint disorders, unspecified hand: Secondary | ICD-10-CM | POA: Insufficient documentation

## 2012-01-23 DIAGNOSIS — Z79899 Other long term (current) drug therapy: Secondary | ICD-10-CM | POA: Insufficient documentation

## 2012-01-23 DIAGNOSIS — I998 Other disorder of circulatory system: Secondary | ICD-10-CM

## 2012-01-23 MED ORDER — OXYCODONE-ACETAMINOPHEN 5-325 MG PO TABS
2.0000 | ORAL_TABLET | Freq: Once | ORAL | Status: AC
  Administered 2012-01-23: 2 via ORAL
  Filled 2012-01-23: qty 2

## 2012-01-23 MED ORDER — OXYCODONE-ACETAMINOPHEN 5-325 MG PO TABS: 2.0000 | ORAL_TABLET | ORAL | Status: DC | PRN

## 2012-01-23 NOTE — ED Notes (Signed)
Pt c/o pain in fingers, tips of fingers have poor circulation and are turning black and blue. Pt reports he was given some pain medicine when he was seen her last but has run out and wasn't able to get a doctors appointment until next month.

## 2012-01-23 NOTE — ED Provider Notes (Signed)
History     CSN: [id number]  Arrival date & time 01/23/12  1738   First MD Initiated Contact with Patient 01/23/12 2245      Chief Complaint  Patient presents with  . fingers turning black     (Consider location/radiation/quality/duration/timing/severity/associated sxs/prior treatment) HPI Comments: This is a 51 year old male, who presents to the emergency department with finger pain. He has past medical history of Buerger's disease. He was recently admitted for the same problem, and has appropriate followup scheduled for later this week. The patient states that he simply ran out of his pain medicine, and would like a little more to last until his appointments. Patient states his pain is 9/10. Symptoms do not radiate.  The history is provided by the patient. No language interpreter was used.    History reviewed. No pertinent past medical history.  Past Surgical History  Procedure Date  . Elbow surgery   . Tonsillectomy     History reviewed. No pertinent family history.  History  Substance Use Topics  . Smoking status: Never Smoker   . Smokeless tobacco: Not on file  . Alcohol Use: Yes      Review of Systems  Constitutional: Negative for fever.  HENT: Negative for rhinorrhea.   Eyes: Negative for visual disturbance.  Respiratory: Negative for shortness of breath.   Cardiovascular: Negative for chest pain.  Gastrointestinal: Negative for abdominal pain.  Genitourinary: Negative for dysuria.  Musculoskeletal:       Pain in digits.  Skin: Positive for color change.       Blackening of digits bilaterally  Neurological: Negative for weakness and numbness.  Psychiatric/Behavioral: The patient is not nervous/anxious.   All other systems reviewed and are negative.    Allergies  Review of patient's allergies indicates no known allergies.  Home Medications   Current Outpatient Rx  Name Route Sig Dispense Refill  . ASPIRIN EC 325 MG PO TBEC Oral Take 1 tablet (325  mg total) by mouth daily.    . IBUPROFEN 800 MG PO TABS Oral Take 800 mg by mouth every 8 (eight) hours as needed. For pain    . NIFEDIPINE ER 30 MG PO TB24 Oral Take 1 tablet (30 mg total) by mouth daily. 30 tablet 0  . PREDNISONE 20 MG PO TABS Oral Take 3 tablets (60 mg total) by mouth daily. 90 tablet 0  . OXYCODONE-ACETAMINOPHEN 10-325 MG PO TABS Oral Take 1 tablet by mouth every 6 (six) hours as needed for pain. 20 tablet 0    BP 146/110  Pulse 91  Temp 97.9 F (36.6 C) (Oral)  Resp 18  SpO2 100%  Physical Exam  Nursing note and vitals reviewed. Constitutional: He is oriented to person, place, and time. He appears well-developed and well-nourished.  HENT:  Head: Normocephalic and atraumatic.  Eyes: Conjunctivae normal and EOM are normal. Pupils are equal, round, and reactive to light.  Neck: Normal range of motion. Neck supple.  Cardiovascular: Normal rate, regular rhythm and normal heart sounds.   Pulmonary/Chest: Effort normal and breath sounds normal.  Abdominal: Soft. Bowel sounds are normal.  Musculoskeletal: Normal range of motion. He exhibits tenderness.       Fingers are noted to be ischemic, and are darkening in color.  Neurological: He is alert and oriented to person, place, and time.  Skin: Skin is warm and dry.  Psychiatric: He has a normal mood and affect. His behavior is normal. Judgment and thought content normal.    ED  Course  Procedures (including critical care time)  Labs Reviewed - No data to display No results found.   1. Ischemic finger       MDM  51 year old male with ischemic fingers. I am going to refill the patient's pain medicine, giving him enough to make it to his appointments later this week. I will give him one dose of his Percocet while in the ED. He has a ride home. Patient is encouraged to followup with his RV scheduled appointments later this week. Patient is stable and ready for discharge.        Roxy Horseman,  PA-C 01/23/12 2316

## 2012-01-23 NOTE — ED Notes (Signed)
Pt reports he has been seen here for decreased circulation to his finger tips. States that he was given pain medication but he is out. Reports he has not been able to get into see a regular pcp but needs more pain medication.

## 2012-01-25 NOTE — ED Provider Notes (Signed)
Medical screening examination/treatment/procedure(s) were performed by non-physician practitioner and as supervising physician I was immediately available for consultation/collaboration.   Jabin Tapp, MD 01/25/12 1323 

## 2012-01-26 LAB — HEPATITIS C GENOTYPE

## 2012-10-10 ENCOUNTER — Emergency Department (HOSPITAL_COMMUNITY)
Admission: EM | Admit: 2012-10-10 | Discharge: 2012-10-10 | Disposition: A | Discharge status: Home / Self Care | Payer: Self-pay | Attending: Emergency Medicine | Admitting: Emergency Medicine

## 2012-10-10 ENCOUNTER — Encounter (HOSPITAL_COMMUNITY): Payer: Self-pay | Admitting: Cardiology

## 2012-10-10 DIAGNOSIS — M79609 Pain in unspecified limb: Secondary | ICD-10-CM | POA: Insufficient documentation

## 2012-10-10 DIAGNOSIS — G8929 Other chronic pain: Secondary | ICD-10-CM | POA: Insufficient documentation

## 2012-10-10 DIAGNOSIS — Z760 Encounter for issue of repeat prescription: Secondary | ICD-10-CM | POA: Insufficient documentation

## 2012-10-10 DIAGNOSIS — S68118A Complete traumatic metacarpophalangeal amputation of other finger, initial encounter: Secondary | ICD-10-CM | POA: Insufficient documentation

## 2012-10-10 DIAGNOSIS — Z7982 Long term (current) use of aspirin: Secondary | ICD-10-CM | POA: Insufficient documentation

## 2012-10-10 MED ORDER — OXYCODONE-ACETAMINOPHEN 5-325 MG PO TABS
2.0000 | ORAL_TABLET | Freq: Once | ORAL | Status: AC
  Administered 2012-10-10: 2 via ORAL
  Filled 2012-10-10: qty 2

## 2012-10-10 MED ORDER — OXYCODONE-ACETAMINOPHEN 10-325 MG PO TABS: 1.0000 | ORAL_TABLET | Freq: Three times a day (TID) | ORAL | Status: DC | PRN

## 2012-10-10 NOTE — ED Notes (Signed)
Pt reports that he needs a refill on his percocet that he takes for the pain in his hands.

## 2012-10-10 NOTE — ED Provider Notes (Signed)
History    CSN: [id number] Arrival date & time 10/10/12  4098  First MD Initiated Contact with Patient 10/10/12 [phone]     Chief Complaint  Patient presents with  . 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 He was admitted in October of 2013 and followed up by Dr. Mina Marble and Dr. [name]. He was incarcerated in January, where he continued to get refills of his medications for his chronic finger pain). He was let out of jail 3 weeks ago and has been using his moms Percocet for pain but she needs it. Therefore, he came to the ER for a refill until his appointment with Dr. [name] on the first of August. He admits he knows the ER is not the place for pain medication refill but he called the office again and is unable to get in sooner. Continues to have intermittent claudication of his distal extremities. No acute complaints at this time.   History reviewed. No pertinent past medical history. Past Surgical History  Procedure Laterality Date  . Elbow surgery    . Tonsillectomy     History reviewed. No pertinent family history. History  Substance Use Topics  . Smoking status: Never Smoker   . Smokeless tobacco: Not on file  . Alcohol Use: Yes    Review of Systems  Review of Systems  Gen: no weight loss, fevers, chills, night sweats  Eyes: no discharge or drainage, no occular pain or visual changes  Nose: no epistaxis or rhinorrhea  Mouth: no dental pain, no sore throat  Neck: no neck pain  Lungs:No wheezing, coughing or hemoptysis CV: no chest pain, palpitations, dependent edema or orthopnea  Abd: no abdominal pain, nausea, vomiting  GU: no dysuria or gross hematuria  MSK:  + Hand pain Neuro: no headache, no focal neurologic  deficits  Skin: no abnormalities Psyche: negative.   Allergies  Review of patient's allergies indicates no known allergies.  Home Medications   Current Outpatient Rx  Name  Route  Sig  Dispense  Refill  . aspirin EC 325 MG tablet   Oral   Take 1 tablet (325 mg total) by mouth daily.         . Aspirin-Salicylamide-Caffeine (BC HEADACHE) 325-95-16 MG TABS   Oral   Take 1 Package by mouth every 6 (six) hours as needed (pain).         Marland Kitchen oxyCODONE-acetaminophen (PERCOCET) 10-325 MG per tablet   Oral   Take 1 tablet by mouth every 8 (eight) hours as needed for pain.   30 tablet   0    BP 135/85  Pulse 76  Temp(Src) 98.3 F (36.8 C) (Oral)  Resp 18  SpO2 100% Physical Exam  Nursing note and vitals reviewed. Constitutional: He appears well-developed and well-nourished. No distress.  HENT:  Head: Normocephalic and atraumatic.  Eyes: Pupils are equal, round, and reactive to light.  Neck: Normal range of motion. Neck supple.  Cardiovascular: Normal rate and regular rhythm.   Pulmonary/Chest: Effort normal.  Abdominal: Soft.  Musculoskeletal:  Bilateral multiple finger amputations. No acute abnormalities. Fingers are warm, dry, pink.  Neurological: He is alert.  Skin: Skin is warm and dry.    ED Course  Procedures (including critical care time) Labs Reviewed - No data to display No results found. 1. Medication  refill     MDM   Patient told: The emergency department does not refill medications and the ER is not an appropriate place to refill pain medication. This will be done for you today only one time. Please see your vascular surgeon or orthopedist for now on for your medications.  Will refill pain medications as a one time favor while he is in between seeing his provider. Looked patient up on drug data base and his story is consistent.  Perc 10-325 #30  51 y.o.Sean [name]'s evaluation in the Emergency Department is complete. It has been determined  that no acute conditions requiring further emergency intervention are present at this time. The patient/guardian have been advised of the diagnosis and plan. We have discussed signs and symptoms that warrant return to the ED, such as changes or worsening in symptoms.  Vital signs are stable at discharge. Filed Vitals:   10/10/12 1045  BP: 135/85  Pulse: 76  Temp:   Resp: 18    Patient/guardian has voiced understanding and agreed to follow-up with the PCP or specialist.   Dorthula Matas, PA-C 10/10/12 1048  Dorthula Matas, PA-C 10/10/12 1048

## 2012-10-10 NOTE — ED Provider Notes (Signed)
Medical screening examination/treatment/procedure(s) were performed by non-physician practitioner and as supervising physician I was immediately available for consultation/collaboration.   Charles B. Sheldon, MD 10/10/12 1104 

## 2012-10-10 NOTE — ED Notes (Signed)
PA at bedside.

## 2012-10-10 NOTE — ED Notes (Signed)
Pt states he has hx of poor circulation in hands and distal finger tips "falling off" Pt states fingers are now painful and he is unable to obtain pain medication due to insurance.

## 2013-02-15 ENCOUNTER — Emergency Department (HOSPITAL_COMMUNITY)
Admission: EM | Admit: 2013-02-15 | Discharge: 2013-02-15 | Disposition: A | Discharge status: Other Acute Inpatient Hospital | Payer: Self-pay | Attending: Emergency Medicine | Admitting: Emergency Medicine

## 2013-02-15 ENCOUNTER — Emergency Department (HOSPITAL_COMMUNITY): Payer: Self-pay

## 2013-02-15 ENCOUNTER — Encounter (HOSPITAL_COMMUNITY): Payer: Self-pay | Admitting: Emergency Medicine

## 2013-02-15 DIAGNOSIS — Y92009 Unspecified place in unspecified non-institutional (private) residence as the place of occurrence of the external cause: Secondary | ICD-10-CM | POA: Insufficient documentation

## 2013-02-15 DIAGNOSIS — X020XXA Exposure to flames in controlled fire in building or structure, initial encounter: Secondary | ICD-10-CM | POA: Insufficient documentation

## 2013-02-15 DIAGNOSIS — Y9389 Activity, other specified: Secondary | ICD-10-CM | POA: Insufficient documentation

## 2013-02-15 DIAGNOSIS — T2020XA Burn of second degree of head, face, and neck, unspecified site, initial encounter: Secondary | ICD-10-CM | POA: Insufficient documentation

## 2013-02-15 LAB — CBC WITH DIFFERENTIAL/PLATELET
Basophils Absolute: 0 10*3/uL (ref 0.0–0.1)
Eosinophils Relative: 1 % (ref 0–5)
HCT: 36.5 % — ABNORMAL LOW (ref 39.0–52.0)
Lymphocytes Relative: 29 % (ref 12–46)
Lymphs Abs: 1.3 10*3/uL (ref 0.7–4.0)
MCHC: 30.4 g/dL (ref 30.0–36.0)
MCV: 70.2 fL — ABNORMAL LOW (ref 78.0–100.0)
Monocytes Absolute: 0.5 10*3/uL (ref 0.1–1.0)
RBC: 5.2 MIL/uL (ref 4.22–5.81)
RDW: 18.6 % — ABNORMAL HIGH (ref 11.5–15.5)
WBC: 4.5 10*3/uL (ref 4.0–10.5)

## 2013-02-15 LAB — COMPREHENSIVE METABOLIC PANEL
ALT: 57 U/L — ABNORMAL HIGH (ref 0–53)
Albumin: 3.9 g/dL (ref 3.5–5.2)
CO2: 15 mEq/L — ABNORMAL LOW (ref 19–32)
Calcium: 9.2 mg/dL (ref 8.4–10.5)
Creatinine, Ser: 0.95 mg/dL (ref 0.50–1.35)
GFR calc Af Amer: >90 mL/min (ref >90)
GFR calc non Af Amer: >90 mL/min (ref >90)
Glucose, Bld: 67 mg/dL — ABNORMAL LOW (ref 70–99)
Sodium: 135 mEq/L (ref 135–145)

## 2013-02-15 LAB — BLOOD GAS, ARTERIAL
Acid-base deficit: 6.1 mmol/L — ABNORMAL HIGH (ref 0.0–2.0)
O2 Saturation: 100 %
TCO2: 19.4 mmol/L (ref 0–100)
pCO2 arterial: 33.3 mmHg — ABNORMAL LOW (ref 35.0–45.0)
pO2, Arterial: 366 mmHg — ABNORMAL HIGH (ref 80.0–100.0)

## 2013-02-15 LAB — POCT I-STAT, CHEM 8
Chloride: 105 mEq/L (ref 96–112)
HCT: 41 % (ref 39.0–52.0)
Potassium: 3.6 mEq/L (ref 3.5–5.1)
Sodium: 142 mEq/L (ref 135–145)
TCO2: 18 mmol/L (ref 0–100)

## 2013-02-15 LAB — POCT I-STAT TROPONIN I: Troponin i, poc: 0 ng/mL (ref 0.00–0.08)

## 2013-02-15 MED ORDER — VECURONIUM BROMIDE 10 MG IV SOLR
INTRAVENOUS | Status: AC
  Filled 2013-02-15: qty 10

## 2013-02-15 MED ORDER — SODIUM CHLORIDE 0.9 % IV SOLN
20.0000 ug/h | INTRAVENOUS | Status: DC
  Administered 2013-02-15: 50 ug/h via INTRAVENOUS
  Filled 2013-02-15: qty 50

## 2013-02-15 MED ORDER — SUCCINYLCHOLINE CHLORIDE 20 MG/ML IJ SOLN
INTRAMUSCULAR | Status: AC | PRN
  Administered 2013-02-15: 100 mg via INTRAVENOUS

## 2013-02-15 MED ORDER — VECURONIUM BROMIDE 10 MG IV SOLR
10.0000 mg | Freq: Once | INTRAVENOUS | Status: AC
  Administered 2013-02-15: 10 mg via INTRAVENOUS

## 2013-02-15 MED ORDER — SODIUM CHLORIDE 0.9 % IV SOLN
1.0000 mg/h | INTRAVENOUS | Status: DC
  Administered 2013-02-15: 2 mg/h via INTRAVENOUS
  Filled 2013-02-15: qty 10

## 2013-02-15 MED ORDER — ETOMIDATE 2 MG/ML IV SOLN
INTRAVENOUS | Status: AC | PRN
  Administered 2013-02-15: 20 mg via INTRAVENOUS

## 2013-02-15 MED ORDER — SODIUM CHLORIDE 0.9 % IV BOLUS (SEPSIS)
1000.0000 mL | Freq: Once | INTRAVENOUS | Status: AC
  Administered 2013-02-15: 1000 mL via INTRAVENOUS

## 2013-02-15 NOTE — ED Notes (Signed)
Report given to [facility] and Victorino Dike, Engineer, drilling) at [facility].

## 2013-02-15 NOTE — Progress Notes (Signed)
Critical Carboxyhemaglobin result of 5.4% given to [facility] on 02/15/13 at 1030. Value was entered incorrectly in [place], therefore, value didn't show up on results page of Epic. RT will continue to monitor.

## 2013-02-15 NOTE — ED Notes (Signed)
Sean [name] (Dad) [phone] Sean [name] (Brother) [phone]
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
## 2013-02-15 NOTE — ED Notes (Addendum)
[facility] at bedside, warm blankets applied for comfort

## 2013-02-15 NOTE — ED Notes (Addendum)
Pt actively vomiting, turned to L side, EDP aware. Sedation drips titrated. Vital signs stable.
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
## 2013-02-15 NOTE — ED Notes (Addendum)
RSI successful. Positive color change. Bilateral breath sounds. 8.0 ET tube secured 22cm at lip. Pt tolerated without difficulty.\[id number]\
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
## 2013-02-15 NOTE — ED Notes (Signed)
Abnormal labs given to Dr. Bernette Mayers

## 2013-02-17 ENCOUNTER — Encounter (HOSPITAL_COMMUNITY): Payer: Self-pay | Admitting: Cardiology

## 2013-03-20 ENCOUNTER — Emergency Department (HOSPITAL_COMMUNITY)
Admission: EM | Admit: 2013-03-20 | Discharge: 2013-03-20 | Disposition: A | Discharge status: Home / Self Care | Payer: PRIVATE HEALTH INSURANCE | Attending: Emergency Medicine | Admitting: Emergency Medicine

## 2013-03-20 ENCOUNTER — Encounter (HOSPITAL_COMMUNITY): Payer: Self-pay | Admitting: Emergency Medicine

## 2013-03-20 DIAGNOSIS — Z760 Encounter for issue of repeat prescription: Secondary | ICD-10-CM

## 2013-03-20 DIAGNOSIS — G8929 Other chronic pain: Secondary | ICD-10-CM

## 2013-03-20 DIAGNOSIS — Z79899 Other long term (current) drug therapy: Secondary | ICD-10-CM | POA: Insufficient documentation

## 2013-03-20 DIAGNOSIS — M25549 Pain in joints of unspecified hand: Secondary | ICD-10-CM | POA: Insufficient documentation

## 2013-03-20 DIAGNOSIS — Z7982 Long term (current) use of aspirin: Secondary | ICD-10-CM | POA: Insufficient documentation

## 2013-03-20 MED ORDER — OXYCODONE-ACETAMINOPHEN 5-325 MG PO TABS
2.0000 | ORAL_TABLET | Freq: Once | ORAL | Status: AC
  Administered 2013-03-20: 2 via ORAL
  Filled 2013-03-20: qty 2

## 2013-03-20 MED ORDER — OXYCODONE-ACETAMINOPHEN 5-325 MG PO TABS: 1.0000 | ORAL_TABLET | Freq: Three times a day (TID) | ORAL | Status: DC | PRN

## 2013-03-20 NOTE — ED Notes (Signed)
Patient states "His fingers are falling off". Multiple complaints  Wants pain medication
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
## 2013-03-28 NOTE — ED Provider Notes (Signed)
Medical screening examination/treatment/procedure(s) were performed by non-physician practitioner and as supervising physician I was immediately available for consultation/collaboration.  EKG Interpretation   None        Teresina Bugaj, MD 03/28/13 1712 

## 2014-03-12 ENCOUNTER — Encounter (HOSPITAL_COMMUNITY): Payer: Self-pay | Admitting: Vascular Surgery

## 2014-08-24 ENCOUNTER — Encounter (HOSPITAL_COMMUNITY): Payer: Self-pay | Admitting: Emergency Medicine

## 2014-08-24 ENCOUNTER — Emergency Department (HOSPITAL_COMMUNITY)
Admission: EM | Admit: 2014-08-24 | Discharge: 2014-08-24 | Disposition: A | Discharge status: Home / Self Care | Payer: PRIVATE HEALTH INSURANCE | Attending: Emergency Medicine | Admitting: Emergency Medicine

## 2014-08-24 DIAGNOSIS — M79641 Pain in right hand: Secondary | ICD-10-CM | POA: Diagnosis not present

## 2014-08-24 DIAGNOSIS — I159 Secondary hypertension, unspecified: Secondary | ICD-10-CM | POA: Diagnosis not present

## 2014-08-24 DIAGNOSIS — M79642 Pain in left hand: Secondary | ICD-10-CM | POA: Diagnosis not present

## 2014-08-24 DIAGNOSIS — Z7982 Long term (current) use of aspirin: Secondary | ICD-10-CM | POA: Insufficient documentation

## 2014-08-24 HISTORY — DX: Unspecified disorder of circulatory system: I99.9

## 2014-08-24 MED ORDER — OXYCODONE-ACETAMINOPHEN 5-325 MG PO TABS: 2.0000 | ORAL_TABLET | ORAL | Status: DC | PRN

## 2014-08-24 NOTE — Discharge Instructions (Signed)
Neuropathic Pain Your BP was 153/111 today.  Follow up with a primary care provider using the resource guide below. We often think that pain has a physical cause. If we get rid of the cause, the pain should go away. Nerves themselves can also cause pain. It is called neuropathic pain, which means nerve abnormality. It may be difficult for the patients who have it and for the treating caregivers. Pain is usually described as acute (short-lived) or chronic (long-lasting). Acute pain is related to the physical sensations caused by an injury. It can last from a few seconds to many weeks, but it usually goes away when normal healing occurs. Chronic pain lasts beyond the typical healing time. With neuropathic pain, the nerve fibers themselves may be damaged or injured. They then send incorrect signals to other pain centers. The pain you feel is real, but the cause is not easy to find.  CAUSES  Chronic pain can result from diseases, such as diabetes and shingles (an infection related to chickenpox), or from trauma, surgery, or amputation. It can also happen without any known injury or disease. The nerves are sending pain messages, even though there is no identifiable cause for such messages.   Other common causes of neuropathy include diabetes, phantom limb pain, or Regional Pain Syndrome (RPS).  As with all forms of chronic back pain, if neuropathy is not correctly treated, there can be a number of associated problems that lead to a downward cycle for the patient. These include depression, sleeplessness, feelings of fear and anxiety, limited social interaction and inability to do normal daily activities or work.  The most dramatic and mysterious example of neuropathic pain is called "phantom limb syndrome." This occurs when an arm or a leg has been removed because of illness or injury. The brain still gets pain messages from the nerves that originally carried impulses from the missing limb. These nerves now seem  to misfire and cause troubling pain.  Neuropathic pain often seems to have no cause. It responds poorly to standard pain treatment. Neuropathic pain can occur after:  Shingles (herpes zoster virus infection).  A lasting burning sensation of the skin, caused usually by injury to a peripheral nerve.  Peripheral neuropathy which is widespread nerve damage, often caused by diabetes or alcoholism.  Phantom limb pain following an amputation.  Facial nerve problems (trigeminal neuralgia).  Multiple sclerosis.  Reflex sympathetic dystrophy.  Pain which comes with cancer and cancer chemotherapy.  Entrapment neuropathy such as when pressure is put on a nerve such as in carpal tunnel syndrome.  Back, leg, and hip problems (sciatica).  Spine or back surgery.  HIV Infection or AIDS where nerves are infected by viruses. Your caregiver can explain items in the above list which may apply to you. SYMPTOMS  Characteristics of neuropathic pain are:  Severe, sharp, electric shock-like, shooting, lightening-like, knife-like.  Pins and needles sensation.  Deep burning, deep cold, or deep ache.  Persistent numbness, tingling, or weakness.  Pain resulting from light touch or other stimulus that would not usually cause pain.  Increased sensitivity to something that would normally cause pain, such as a pinprick. Pain may persist for months or years following the healing of damaged tissues. When this happens, pain signals no longer sound an alarm about current injuries or injuries about to happen. Instead, the alarm system itself is not working correctly.  Neuropathic pain may get worse instead of better over time. For some people, it can lead to serious disability. It  is important to be aware that severe injury in a limb can occur without a proper, protective pain response.Burns, cuts, and other injuries may go unnoticed. Without proper treatment, these injuries can become infected or lead to  further disability. Take any injury seriously, and consult your caregiver for treatment. DIAGNOSIS  When you have a pain with no known cause, your caregiver will probably ask some specific questions:   Do you have any other conditions, such as diabetes, shingles, multiple sclerosis, or HIV infection?  How would you describe your pain? (Neuropathic pain is often described as shooting, stabbing, burning, or searing.)  Is your pain worse at any time of the day? (Neuropathic pain is usually worse at night.)  Does the pain seem to follow a certain physical pathway?  Does the pain come from an area that has missing or injured nerves? (An example would be phantom limb pain.)  Is the pain triggered by minor things such as rubbing against the sheets at night? These questions often help define the type of pain involved. Once your caregiver knows what is happening, treatment can begin. Anticonvulsant, antidepressant drugs, and various pain relievers seem to work in some cases. If another condition, such as diabetes is involved, better management of that disorder may relieve the neuropathic pain.  TREATMENT  Neuropathic pain is frequently long-lasting and tends not to respond to treatment with narcotic type pain medication. It may respond well to other drugs such as antiseizure and antidepressant medications. Usually, neuropathic problems do not completely go away, but partial improvement is often possible with proper treatment. Your caregivers have large numbers of medications available to treat you. Do not be discouraged if you do not get immediate relief. Sometimes different medications or a combination of medications will be tried before you receive the results you are hoping for. See your caregiver if you have pain that seems to be coming from nowhere and does not go away. Help is available.  SEEK IMMEDIATE MEDICAL CARE IF:   There is a sudden change in the quality of your pain, especially if the change  is on only one side of the body.  You notice changes of the skin, such as redness, black or purple discoloration, swelling, or an ulcer.  You cannot move the affected limbs. Document Released: 12/16/2003 Document Revised: 06/12/2011 Document Reviewed: 12/16/2003 [facility] Patient Information 2015 [place], [place]. This information is not intended to replace advice given to you by your health care provider. Make sure you discuss any questions you have with your health care provider.  Emergency Department Resource Guide 1) Find a Doctor and Pay Out of Pocket Although you won't have to find out who is covered by your insurance plan, it is a good idea to ask around and get recommendations. You will then need to call the office and see if the doctor you have chosen will accept you as a new patient and what types of options they offer for patients who are self-pay. Some doctors offer discounts or will set up payment plans for their patients who do not have insurance, but you will need to ask so you aren't surprised when you get to your appointment.  2) Contact Your Local Health Department Not all health departments have doctors that can see patients for sick visits, but many do, so it is worth a call to see if yours does. If you don't know where your local health department is, you can check in your phone book. The CDC also has a  tool to help you locate your state's health department, and many state websites also have listings of all of their local health departments.  3) Find a [facility] If your illness is not likely to be very severe or complicated, you may want to try a walk in clinic. These are popping up all over the country in pharmacies, drugstores, and shopping centers. They're usually staffed by nurse practitioners or physician assistants that have been trained to treat common illnesses and complaints. They're usually fairly quick and inexpensive. However, if you have serious medical issues or  chronic medical problems, these are probably not your best option.  No Primary Care Doctor: - Call Health Connect at  [phone] - they can help you locate a primary care doctor that  accepts your insurance, provides certain services, etc. - Physician Referral Service- [phone]  Chronic Pain Problems: Organization         Address  Phone   Notes  [facility]  [phone] Patients need to be referred by their primary care doctor.   Medication Assistance: Organization         Address  Phone   Notes  [facility] Medication [facility] [address] [place], [place] [address] [phone] --Must be a resident of [facility] -- Must have NO insurance coverage whatsoever (no Medicaid/ Medicare, etc.) -- The pt. MUST have a primary care doctor that directs their care regularly and follows them in the community   MedAssist  [phone]   [employer]  [phone]    Agencies that provide inexpensive medical care: Organization         Address  Phone   Notes  [place]  [phone]   [facility]    [phone]   [facility] [place], [place] [address] [phone]   [place] [address], [place] [phone]   Planned Parenthood    [phone]   [facility]    [phone]   [place] and [address], [place] Phone:  [phone], Fax:  [phone] Hours of Operation:  9 am - 6 pm, M-F.  Also accepts Medicaid/Medicare and self-pay.  [facility] for [place] [place], [address], [place] Phone: [phone], Fax: [phone]. Hours of Operation:  8:30 am - 5:30 pm, M-F.  Also accepts Medicaid and self-pay.  [facility] [place] [address] [place] Phone: [phone]   [place], [place], [place]  [phone], Ext. [phone] Mondays & Thursdays: 7-9 AM.  First 15 patients are seen on a first come, first serve basis.    [place] Providers:  Organization         Address  Phone   Notes  [facility] [address], [place] [phone] Also accepts self-pay patients.  [facility] [address], [place]  [phone]   [place], [address], [place] [phone]   [facility] [address], [place] [phone]   Lucianne Lei [address], [place]   [phone] Only accepts [place] Access [place] patients after they have their name applied to their card.   Self-Pay (no insurance) in [place]:  Organization         Address  Phone   Notes  Sickle Cell Patients, [facility] [place] [phone]   [facility] [place] [phone]   [facility] [place]  [place], [address], [place] [phone]   [facility]/Dr. Osei-Bonsu  [address], [place] or [address], [place] [phone] Phone number for both [place] and [place] locations is the same.  [facility]gent [facility] and [facility] [address] [place] [phone]   [facility] [address] [place] or [address] [phone]   [facility] [address], [place] [phone], phone; [phone], fax Sees patients 1st and 3rd Saturday of every month.  Must not qualify for public or private insurance (i.e. Medicaid, Medicare, [place] Health Choice, Veterans' Benefits)  Household income should be no more than 200% of the poverty level The clinic cannot treat you if you are pregnant or think you are pregnant  Sexually transmitted  diseases are not treated at the clinic.    Dental Care: Organization         Address  Phone  Notes  [facility] Department of [facility] [place] [phone] Accepts children up to age 52 who are enrolled in [place] or [place]; pregnant women with a Medicaid card; and children who have applied for Medicaid or [place] Health Choice, but were declined, whose parents can pay a reduced fee at time of service.  [facility] Department of [facility]  [address], [place] [phone] Accepts children up to age 45 who are enrolled in [place] or [place]; pregnant women with a Medicaid card; and children who have applied for Medicaid or [place] Health Choice, but were declined, whose parents can pay a reduced fee at time of service.  [place] Adult Dental Access PROGRAM  [place] [phone] Patients are seen by appointment only. Walk-ins are not accepted. [place] will see patients 47 years of age and older. Monday - Tuesday (8am-5pm) Most Wednesdays (8:30-5pm) $30 per visit, cash only  [facility] Adult Dental Access PROGRAM  [address], [facility] [phone] Patients are seen by appointment only. Walk-ins are not accepted. [place] will see patients 20 years of age and older. One Wednesday Evening (Monthly: Volunteer Based).  $30 per visit, cash only  [place]  [phone] for adults; Children under age 27, call Graduate Pediatric Dentistry at [phone]. Children aged 77-14, please call [phone] to request a pediatric application.  Dental services are provided in all areas of dental care including fillings, crowns and bridges, complete and partial dentures, implants, gum treatment, root canals, and extractions. Preventive care is also provided. Treatment is provided to both adults and children. Patients are selected via a  lottery and there is often a waiting list.   [facility] [address], [place]  [phone] [email or web address]   [facility] [address] [place], [place] [phone], Ext. [phone] Second and Fourth Thursday of each month, opens at 6:30 AM; Clinic ends at 9 AM.  Patients are seen on a first-come first-served basis, and a limited number are seen during each clinic.  [facility]  [address] Danker [place], [place] [phone]   Eligibility Requirements You must have lived in [place], [place], or [place] counties for at least the last three months.   You cannot be eligible for state or federal sponsored [employer], including [employer], [place], or [employer].   You generally cannot be eligible for healthcare insurance through your employer.    How to apply: Eligibility screenings are held every Tuesday and Wednesday afternoon from 1:00 pm until 4:00 pm. You do not need an appointment for the interview!  [facility] [address], [place], [place]   [place]  [place] Department  [place]  [phone]    Behavioral Health Resources in the Community: Intensive Outpatient Programs Organization         Address  Phone  Notes  [place]. [address], [place], [place] [phone]   [facility] [address] [place], [place]   ADS: Alcohol & Drug S[facility] [address] [place], [place]   [place] [address]  [place], [place] or [phone]   Substance Abuse Resources Organization         Address  Phone  Notes  Alcohol and Drug Services  [phone]   [place]  [phone]   [facility] [place]   Chinita Pester  [phone]   Residential &  Outpatient Substance Abuse Program  [phone]   Psychological Services Organization         Address  Phone  Notes  [facility]  [place]  [phone]   [place] [address], [place] or [phone]    Mobile Crisis Teams Organization         Address  Phone  Notes  [facility], Mobile Crisis Care Unit  [phone]   [facility]  [address] [place], [place]   [place] [address] [place] [phone]    Self-Help/Support Groups Organization         Address  Phone             Notes  [place]. of [place] - variety of support groups  [place] Call for more information  Narcotics Anonymous (NA), [facility]aring [facility] [address], [employer] [place]  2 meetings at this location   Special educational needs teacher         Address  Phone  Notes  ASAP Residential Treatment [place],    [place]  [phone]   [facility]  [address], [place] [phone], [place], [place]   [place], [place] [phone] Admissions: 8am-3pm M-F  Incentives Substance [place] [address],    [place], [place] [phone]   [facility] [address] Jadene Pierini [place], [place]   [facility] [address],  [place], [place]   [facility] Programs - Intensive Outpatient [address], [place], [place]   [facility] ([place] [place].,  [place], [place] or [phone]   [facility] Services (RTS) [address] [place], [place] Accepts Medicaid  [facility] [place] [address],  [place] [phone] Substance Abuse/Addiction Treatment  [facility] Organization          Address  Phone  Notes  [facility]  [phone]   Domenic Schwab, PhD [address] Arlis Porta [place], [place]   [phone] or [phone]   [place], [place] [phone]   [place] [address], [place], [place] [phone] Insurance/Medicaid/sponsorship through [facility] and Families [address], [address] [place]                                    [place], [place] [phone] [place] [address], [place] [phone]    Dr. Adele Schilder  [phone]   [facility] [place] Dept. 1) [address] [place] 2) [place] 3)  [place] [address], [place] [phone]  [phone]   [place] [phone] or [phone] (After Hours)

## 2014-08-24 NOTE — ED Notes (Signed)
Pt states he has circulatory problems to his fingers. Has several fingers with tips missing. States "it is starting to act up" and needs pain meds. No PCP.

## 2014-08-24 NOTE — ED Provider Notes (Signed)
CSN: [id number]     Arrival date & time 08/24/14  1310 History  This chart was scribed for non-physician practitioner working, Haynes DageHannah Patel-Mills, PA-C,  with Gwyneth SproutWhitney Plunkett, MD, by Modena JanskyAlbert Thayil, ED Scribe. This patient was seen in room TR05C/TR05C and the patient's care was started at 1:33 PM.   Chief Complaint  Patient presents with  . Medication Refill  . Hand Pain   The history is provided by the patient. No language interpreter was used.   HPI Comments: Sean [name] is a 54 y.o. male who presents to the Emergency Department complaining of constant moderate hand pain that started about a month ago. He reports that he has circulation problems in his finger tips and has been told to take aspirin for the problem in the past. He states that some of his finger tips "have fell off". He states that is circulation problem is acting up again, and wants something for pain. He reports that he has been taking BC powder and ibuprofen without relief. No pcp follow up.  Works in [employer] and with his hands a lot. He denies any weakness in his hands. He denies any numbness or tingling in his hands.  He denies any discoloration to the hands or fingers bilaterally.   Past Medical History  Diagnosis Date  . 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 Elbow surgery    . Tonsillectomy    . Bilateral upper extremity angiogram N/A 01/15/2012    Procedure: BILATERAL UPPER EXTREMITY ANGIOGRAM;  Surgeon: Fransisco HertzBrian L Chen, MD;  [facility];  Service: Cardiovascular;  Laterality: N/A;   No family history on file. History  Substance Use Topics  . Smoking status: Never Smoker   . Smokeless tobacco: Not on file  . Alcohol Use: 0.0 oz/week     Comment: 2 cans of beer daily    Review of Systems  Constitutional: Negative for fever.  Musculoskeletal: Positive for myalgias.  Skin: Negative for rash and wound.  Neurological: Negative for numbness.    Allergies  Review of patient's allergies indicates no known allergies.  Home Medications   Prior to Admission medications   Medication Sig Start Date End Date Taking? Authorizing Provider  aspirin EC 325 MG tablet Take 1 tablet (325 mg total) by mouth daily. 01/19/12   Clydia LlanoMutaz Elmahi, MD  Aspirin-Salicylamide-Caffeine (BC HEADACHE) 325-95-16 MG TABS Take 1 Package by mouth every 6 (six) hours as needed (pain).    Historical Provider, MD  oxyCODONE-acetaminophen (PERCOCET/ROXICET) 5-325 MG per tablet Take 2 tablets by mouth every 4 (four) hours as needed for severe pain. 08/24/14   Nickola Lenig Patel-Mills, PA-C   BP 153/111 mmHg  Pulse 88  Temp(Src) 97.9 F (36.6 C) (Oral)  Resp 18  Ht 5\' 5"  (1.651 m)  Wt 145 lb (65.772 kg)  BMI 24.13 kg/m2  SpO2 99% Physical Exam  Constitutional: He is oriented to person, place, and time. He appears well-developed and well-nourished. No distress.  HENT:  Head: Normocephalic and atraumatic.  Neck: Neck supple.  Cardiovascular: Normal rate, regular rhythm and normal heart sounds.   Good bilateral radial pulses.  Pulmonary/Chest: Effort normal. No respiratory distress.  Musculoskeletal: Normal range of motion.  Good bilateral hand and grip stength.  No signs of infection including erythema, warmth or discoloration.  He does have some tenderness along the tip of the distal left 4th finger.   Neurological: He is alert and oriented to  person, place, and time.  Skin: Skin is warm and dry.  Less than 2 seconds capillary refill.  Psychiatric: He has a normal mood and affect. His behavior is normal.  Nursing note and vitals reviewed.   ED Course  Procedures (including critical care time) DIAGNOSTIC STUDIES: Oxygen Saturation is 99% on RA, normal by my interpretation.    COORDINATION OF CARE: 1:37 PM- Pt advised of plan for treatment and pt agrees.  Labs Review Labs Reviewed - No data to display  Imaging Review No results found.   EKG  Interpretation None      MDM   Final diagnoses:  Bilateral hand pain  Secondary hypertension, unspecified  Patient presents for bilateral finger tip pain. He states he has vascular issues but refused care years ago.  He exam is normal. Afebrile. He does have hight blood pressure and does not take his medications.  I have discussed pcp follow up for both hand pain and BP.  I gave him the resource guide to follow up.  I have given him percocet for pain but did discuss need for follow up.   I personally performed the services described in this documentation, which was scribed in my presence. The recorded information has been reviewed and is accurate.    Catha Gosselin, PA-C 08/24/14 1422  Gwyneth Sprout, MD 08/25/14 1755

## 2014-09-28 ENCOUNTER — Ambulatory Visit: Payer: PRIVATE HEALTH INSURANCE | Attending: Internal Medicine

## 2015-04-16 ENCOUNTER — Encounter (HOSPITAL_COMMUNITY): Payer: Self-pay | Admitting: Family Medicine

## 2015-04-16 ENCOUNTER — Emergency Department (HOSPITAL_COMMUNITY): Payer: Worker's Compensation

## 2015-04-16 ENCOUNTER — Emergency Department (HOSPITAL_COMMUNITY)
Admission: EM | Admit: 2015-04-16 | Discharge: 2015-04-16 | Disposition: A | Discharge status: Home / Self Care | Payer: Worker's Compensation | Attending: Emergency Medicine | Admitting: Emergency Medicine

## 2015-04-16 DIAGNOSIS — S6992XA Unspecified injury of left wrist, hand and finger(s), initial encounter: Secondary | ICD-10-CM | POA: Diagnosis present

## 2015-04-16 DIAGNOSIS — W11XXXA Fall on and from ladder, initial encounter: Secondary | ICD-10-CM | POA: Insufficient documentation

## 2015-04-16 DIAGNOSIS — Z8679 Personal history of other diseases of the circulatory system: Secondary | ICD-10-CM | POA: Insufficient documentation

## 2015-04-16 DIAGNOSIS — S59202A Unspecified physeal fracture of lower end of radius, left arm, initial encounter for closed fracture: Secondary | ICD-10-CM | POA: Insufficient documentation

## 2015-04-16 DIAGNOSIS — Y9389 Activity, other specified: Secondary | ICD-10-CM | POA: Insufficient documentation

## 2015-04-16 DIAGNOSIS — Y998 Other external cause status: Secondary | ICD-10-CM | POA: Insufficient documentation

## 2015-04-16 DIAGNOSIS — W19XXXA Unspecified fall, initial encounter: Secondary | ICD-10-CM

## 2015-04-16 DIAGNOSIS — S32019A Unspecified fracture of first lumbar vertebra, initial encounter for closed fracture: Secondary | ICD-10-CM | POA: Diagnosis not present

## 2015-04-16 DIAGNOSIS — Z7982 Long term (current) use of aspirin: Secondary | ICD-10-CM | POA: Insufficient documentation

## 2015-04-16 DIAGNOSIS — Y9289 Other specified places as the place of occurrence of the external cause: Secondary | ICD-10-CM | POA: Diagnosis not present

## 2015-04-16 DIAGNOSIS — S52509A Unspecified fracture of the lower end of unspecified radius, initial encounter for closed fracture: Secondary | ICD-10-CM

## 2015-04-16 LAB — CBC WITH DIFFERENTIAL/PLATELET
Basophils Absolute: 0 10*3/uL (ref 0.0–0.1)
Basophils Relative: 0 %
EOS PCT: 1 %
Eosinophils Absolute: 0.1 10*3/uL (ref 0.0–0.7)
HEMATOCRIT: 32.4 % — AB (ref 39.0–52.0)
HEMOGLOBIN: 10.5 g/dL — AB (ref 13.0–17.0)
LYMPHS ABS: 0.8 10*3/uL (ref 0.7–4.0)
LYMPHS PCT: 14 %
MCH: 26.9 pg (ref 26.0–34.0)
MCHC: 32.4 g/dL (ref 30.0–36.0)
MCV: 82.9 fL (ref 78.0–100.0)
Monocytes Absolute: 0.8 10*3/uL (ref 0.1–1.0)
Monocytes Relative: 15 %
NEUTROS ABS: 4 10*3/uL (ref 1.7–7.7)
NEUTROS PCT: 70 %
Platelets: 321 10*3/uL (ref 150–400)
RBC: 3.91 MIL/uL — AB (ref 4.22–5.81)
RDW: 16.7 % — ABNORMAL HIGH (ref 11.5–15.5)
WBC: 5.7 10*3/uL (ref 4.0–10.5)

## 2015-04-16 LAB — I-STAT CHEM 8, ED
BUN: 8 mg/dL (ref 6–20)
CHLORIDE: 97 mmol/L — AB (ref 101–111)
Calcium, Ion: 1 mmol/L — ABNORMAL LOW (ref 1.12–1.23)
Creatinine, Ser: 1.1 mg/dL (ref 0.61–1.24)
GLUCOSE: 88 mg/dL (ref 65–99)
HCT: 38 % — ABNORMAL LOW (ref 39.0–52.0)
Hemoglobin: 12.9 g/dL — ABNORMAL LOW (ref 13.0–17.0)
POTASSIUM: 3.5 mmol/L (ref 3.5–5.1)
Sodium: 134 mmol/L — ABNORMAL LOW (ref 135–145)
TCO2: 21 mmol/L (ref 0–100)

## 2015-04-16 LAB — HEPATIC FUNCTION PANEL
ALK PHOS: 72 U/L (ref 38–126)
ALT: 109 U/L — AB (ref 17–63)
AST: 219 U/L — ABNORMAL HIGH (ref 15–41)
Albumin: 3 g/dL — ABNORMAL LOW (ref 3.5–5.0)
BILIRUBIN DIRECT: 0.3 mg/dL (ref 0.1–0.5)
BILIRUBIN INDIRECT: 0.4 mg/dL (ref 0.3–0.9)
BILIRUBIN TOTAL: 0.7 mg/dL (ref 0.3–1.2)
Total Protein: 9.2 g/dL — ABNORMAL HIGH (ref 6.5–8.1)

## 2015-04-16 MED ORDER — ONDANSETRON HCL 4 MG/2ML IJ SOLN
4.0000 mg | Freq: Once | INTRAMUSCULAR | Status: AC
  Administered 2015-04-16: 4 mg via INTRAVENOUS
  Filled 2015-04-16: qty 2

## 2015-04-16 MED ORDER — HYDROMORPHONE HCL 1 MG/ML IJ SOLN
1.0000 mg | Freq: Once | INTRAMUSCULAR | Status: AC
  Administered 2015-04-16: 1 mg via INTRAVENOUS
  Filled 2015-04-16: qty 1

## 2015-04-16 MED ORDER — SODIUM CHLORIDE 0.9 % IV BOLUS (SEPSIS)
1000.0000 mL | Freq: Once | INTRAVENOUS | Status: AC
  Administered 2015-04-16: 1000 mL via INTRAVENOUS

## 2015-04-16 MED ORDER — OXYCODONE-ACETAMINOPHEN 5-325 MG PO TABS: 1.0000 | ORAL_TABLET | Freq: Four times a day (QID) | ORAL | Status: DC | PRN

## 2015-04-16 MED ORDER — IOHEXOL 300 MG/ML  SOLN
100.0000 mL | Freq: Once | INTRAMUSCULAR | Status: AC | PRN
  Administered 2015-04-16: 100 mL via INTRAVENOUS

## 2015-04-16 NOTE — Discharge Instructions (Signed)
Follow up with dr. August Saucerean for your back and wrist next week.

## 2015-04-16 NOTE — ED Provider Notes (Signed)
CSN: [id number]     Arrival date & time 04/16/15  1205 History   First MD Initiated Contact with Patient 04/16/15 1303     Chief Complaint  Patient presents with  . Fall     (Consider location/radiation/quality/duration/timing/severity/associated sxs/prior Treatment) Patient is a 55 y.o. male presenting with fall. The history is provided by the patient (Patient said he fell from a ladder outside [facility] no loss of consciousness patient complains of pain to left wrist and lower back).  Fall This is a new problem. The current episode started 1 to 2 hours ago. The problem occurs constantly. The problem has not changed since onset.Pertinent negatives include no chest pain, no abdominal pain and no headaches. Exacerbated by: Movement. Nothing relieves the symptoms.    Past Medical History  Diagnosis Date  . 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 Elbow surgery    . Tonsillectomy    . Bilateral upper extremity angiogram N/A 01/15/2012    Procedure: BILATERAL UPPER EXTREMITY ANGIOGRAM;  Surgeon: Fransisco Hertz, MD;  Location: [facility] CATH LAB;  Service: Cardiovascular;  Laterality: N/A;   History reviewed. No pertinent family history. Social History  Substance Use Topics  . Smoking status: Never Smoker   . Smokeless tobacco: None  . Alcohol Use: 0.0 oz/week     Comment: 2 cans of beer daily    Review of Systems  Constitutional: Negative for appetite change and fatigue.  HENT: Negative for congestion, ear discharge and sinus pressure.   Eyes: Negative for discharge.  Respiratory: Negative for cough.   Cardiovascular: Negative for chest pain.  Gastrointestinal: Negative for abdominal pain and diarrhea.  Genitourinary: Negative for frequency and hematuria.  Musculoskeletal: Positive for back pain.       Left wrist pain  Skin: Negative for rash.  Neurological: Negative for seizures and headaches.  Psychiatric/Behavioral: Negative for  hallucinations.      Allergies  Review of patient's allergies indicates no known allergies.  Home Medications   Prior to Admission medications   Medication Sig Start Date End Date Taking? Authorizing Provider  acetaminophen (TYLENOL) 500 MG tablet Take 1,000 mg by mouth every 6 (six) hours as needed for mild pain.   Yes Historical Provider, MD  aspirin EC 325 MG tablet Take 1 tablet (325 mg total) by mouth daily. 01/19/12  Yes Clydia Llano, MD  Aspirin-Salicylamide-Caffeine (BC HEADACHE) 325-95-16 MG TABS Take 1 Package by mouth every 6 (six) hours as needed (pain).   Yes Historical Provider, MD  oxyCODONE-acetaminophen (PERCOCET/ROXICET) 5-325 MG tablet Take 1 tablet by mouth every 6 (six) hours as needed for severe pain. 04/16/15   Bethann Berkshire, MD   BP 141/88 mmHg  Pulse 79  Temp(Src) 98.4 F (36.9 C)  Resp 18  SpO2 98% Physical Exam  Constitutional: He is oriented to person, place, and time. He appears well-developed.  HENT:  Head: Normocephalic.  Eyes: Conjunctivae and EOM are normal. No scleral icterus.  Neck: Neck supple. No thyromegaly present.  Cardiovascular: Normal rate and regular rhythm.  Exam reveals no gallop and no friction rub.   No murmur heard. Pulmonary/Chest: No stridor. He has no wheezes. He has no rales. He exhibits no tenderness.  Abdominal: He exhibits no distension. There is no tenderness. There is no rebound.  Musculoskeletal: Normal range of motion. He exhibits no edema.  Mild tenderness to her spine. Some swelling and tenderness to left wrist neurovascular exam normal  Lymphadenopathy:    He has no cervical adenopathy.  Neurological: He is oriented to person, place, and time. He exhibits normal muscle tone. Coordination normal.  Skin: No rash noted. No erythema.  Psychiatric: He has a normal mood and affect. His behavior is normal.    ED Course  Procedures (including critical care time) Labs Review Labs Reviewed  CBC WITH  DIFFERENTIAL/PLATELET - Abnormal; Notable for the following:    RBC 3.91 (*)    Hemoglobin 10.5 (*)    HCT 32.4 (*)    RDW 16.7 (*)    All other components within normal limits  HEPATIC FUNCTION PANEL - Abnormal; Notable for the following:    Total Protein 9.2 (*)    Albumin 3.0 (*)    AST 219 (*)    ALT 109 (*)    All other components within normal limits  I-STAT CHEM 8, ED - Abnormal; Notable for the following:    Sodium 134 (*)    Chloride 97 (*)    Calcium, Ion 1.00 (*)    Hemoglobin 12.9 (*)    HCT 38.0 (*)    All other components within normal limits    Imaging Review Ct Head Wo Contrast  04/16/2015  CLINICAL DATA:  55 year old male who fell from 10 feet from a ladder today. Laceration of oral tongue. Pain. Initial encounter. EXAM: CT HEAD WITHOUT CONTRAST CT CERVICAL SPINE WITHOUT CONTRAST TECHNIQUE: Multidetector CT imaging of the head and cervical spine was performed following the standard protocol without intravenous contrast. Multiplanar CT image reconstructions of the cervical spine were also generated. COMPARISON:  None. FINDINGS: CT HEAD FINDINGS Visualized paranasal sinuses and mastoids are clear. Suggestion of chronic left orbital floor fracture. Orbits soft tissues otherwise appear normal. Acute versus chronic right nasal bone fracture. There is mild overlying soft tissue swelling. Other visible facial bones appear intact. No scalp hematoma identified.  Calvarium intact. Chronic encephalomalacia right inferior frontal gyrus, operculum, and right anterior temporal lobe. Elsewhere Gray-white matter differentiation is within normal limits throughout the brain. No midline shift, ventriculomegaly, mass effect, evidence of mass lesion, intracranial hemorrhage or evidence of cortically based acute infarction. CT CERVICAL SPINE FINDINGS Straightening of cervical lordosis. Widespread chronic disc and endplate degeneration, severe C5-C6 and C6-C7. Trace anterolisthesis at C4-C5 with  associated facet degeneration. Widespread chronic cervical facet degeneration elsewhere. Bilateral posterior element alignment is within normal limits. Visualized skull base is intact. No atlanto-occipital dissociation. Cervicothoracic junction alignment is within normal limits. No cervical spine fracture identified. Visible upper thoracic levels appear grossly intact. Negative lung apices. Calcified carotid atherosclerosis greater on the left. Otherwise negative noncontrast paraspinal soft tissues. IMPRESSION: 1. No acute traumatic injury to the brain identified. Chronic encephalomalacia in the right hemisphere is likely the sequelae of remote trauma. 2. Acute versus chronic right nasal bone fracture. Chronic appearing left orbital floor fracture. 3. No acute fracture or listhesis identified in the cervical spine. Ligamentous injury is not excluded. 4. Advanced disc and endplate degeneration at C5-C6 and C6-C7. Widespread advanced cervical facet degeneration. Electronically Signed   By: Odessa Fleming M.D.   On: 04/16/2015 14:50   Ct Chest W Contrast  04/16/2015  CLINICAL DATA:  Patient fell approximately 10 feet from a ladder onto his back. Complaining of pain. EXAM: CT CHEST, ABDOMEN, AND PELVIS WITH CONTRAST TECHNIQUE: Multidetector CT imaging of the chest, abdomen and pelvis was performed following the standard protocol during bolus administration of intravenous contrast. CONTRAST:  OMNIPAQUE IOHEXOL 300 MG/ML  SOLN  COMPARISON:  None. FINDINGS: CT CHEST Neck base and axilla: No mass or adenopathy. Visualized thyroid is unremarkable. Mediastinum and hila: Normal heart. Great vessels are normal in caliber. No evidence of a vascular injury or mediastinal hematoma. No mediastinal or hilar masses or pathologically enlarged lymph nodes. Lungs and pleura: Minor dependent subsegmental atelectasis. Otherwise clear. No lung contusion or laceration. No pleural effusion or pneumothorax. CT ABDOMEN AND PELVIS Liver: Fatty  infiltration. No mass or focal lesion. No contusion or laceration. Spleen:  Normal. Gallbladder and biliary tree:  Unremarkable. Pancreas: Mild dilation of the pancreatic duct, most prominent in the pancreatic head where it measures 5 mm in greatest diameter. No pancreatic mass or inflammation. No evidence of a pancreatic laceration. Adrenal glands: Unremarkable. Kidneys, ureters, bladder: Sub cm upper pole low-density lesion in the right kidney consistent with cysts. No other renal masses. No stones. No hydronephrosis. No evidence of a renal contusion or laceration. Ureters are normal in course and in caliber. Bladder is unremarkable. Lymph nodes: Mildly prominent periceliac and gastrohepatic ligament nodes, none pathologically enlarged by size criteria. There are few prominent left retroperitoneal lymph nodes, largest measuring 7 mm in short axis. Ascites/pneumoperitoneum: None. Gastrointestinal and mesentery: Stomach, small bowel are unremarkable. Few left colon diverticula. Colon otherwise normal. Normal appendix visualized. No evidence of a bowel wall hematoma or mesenteric hematoma. Abdominal wall: Small fat containing umbilical hernia. Otherwise unremarkable. MUSCULOSKELETAL Fracture along the anterior upper endplate of L1. Although there is no associated hematoma or significant edema, this does appear recent/ acute. No other fractures. Degenerative changes are noted along the visualized spine. There is a chronic pars defect on the right at L5-S1. No osteoblastic or osteolytic lesions. IMPRESSION: 1. Mild compression fracture of the upper endplate of L1, which appears recent/acute. This does not affect the spinal canal or neural foramina. 2. No other acute findings within the chest, abdomen or pelvis. 3. Hepatic steatosis. Mild dilation of pancreatic duct, presumed chronic. Electronically Signed   By: Amie Portlandavid  Ormond M.D.   On: 04/16/2015 14:59   Ct Cervical Spine Wo Contrast  04/16/2015  CLINICAL DATA:   55 year old male who fell from 10 feet from a ladder today. Laceration of oral tongue. Pain. Initial encounter. EXAM: CT HEAD WITHOUT CONTRAST CT CERVICAL SPINE WITHOUT CONTRAST TECHNIQUE: Multidetector CT imaging of the head and cervical spine was performed following the standard protocol without intravenous contrast. Multiplanar CT image reconstructions of the cervical spine were also generated. COMPARISON:  None. FINDINGS: CT HEAD FINDINGS Visualized paranasal sinuses and mastoids are clear. Suggestion of chronic left orbital floor fracture. Orbits soft tissues otherwise appear normal. Acute versus chronic right nasal bone fracture. There is mild overlying soft tissue swelling. Other visible facial bones appear intact. No scalp hematoma identified.  Calvarium intact. Chronic encephalomalacia right inferior frontal gyrus, operculum, and right anterior temporal lobe. Elsewhere Gray-white matter differentiation is within normal limits throughout the brain. No midline shift, ventriculomegaly, mass effect, evidence of mass lesion, intracranial hemorrhage or evidence of cortically based acute infarction. CT CERVICAL SPINE FINDINGS Straightening of cervical lordosis. Widespread chronic disc and endplate degeneration, severe C5-C6 and C6-C7. Trace anterolisthesis at C4-C5 with associated facet degeneration. Widespread chronic cervical facet degeneration elsewhere. Bilateral posterior element alignment is within normal limits. Visualized skull base is intact. No atlanto-occipital dissociation. Cervicothoracic junction alignment is within normal limits. No cervical spine fracture identified. Visible upper thoracic levels appear grossly intact. Negative lung apices. Calcified carotid atherosclerosis greater on the left. Otherwise negative noncontrast paraspinal soft tissues.  IMPRESSION: 1. No acute traumatic injury to the brain identified. Chronic encephalomalacia in the right hemisphere is likely the sequelae of remote  trauma. 2. Acute versus chronic right nasal bone fracture. Chronic appearing left orbital floor fracture. 3. No acute fracture or listhesis identified in the cervical spine. Ligamentous injury is not excluded. 4. Advanced disc and endplate degeneration at C5-C6 and C6-C7. Widespread advanced cervical facet degeneration. Electronically Signed   By: Odessa Fleming M.D.   On: 04/16/2015 14:50   Ct Abdomen Pelvis W Contrast  04/16/2015  CLINICAL DATA:  Patient fell approximately 10 feet from a ladder onto his back. Complaining of pain. EXAM: CT CHEST, ABDOMEN, AND PELVIS WITH CONTRAST TECHNIQUE: Multidetector CT imaging of the chest, abdomen and pelvis was performed following the standard protocol during bolus administration of intravenous contrast. CONTRAST:  OMNIPAQUE IOHEXOL 300 MG/ML  SOLN COMPARISON:  None. FINDINGS: CT CHEST Neck base and axilla: No mass or adenopathy. Visualized thyroid is unremarkable. Mediastinum and hila: Normal heart. Great vessels are normal in caliber. No evidence of a vascular injury or mediastinal hematoma. No mediastinal or hilar masses or pathologically enlarged lymph nodes. Lungs and pleura: Minor dependent subsegmental atelectasis. Otherwise clear. No lung contusion or laceration. No pleural effusion or pneumothorax. CT ABDOMEN AND PELVIS Liver: Fatty infiltration. No mass or focal lesion. No contusion or laceration. Spleen:  Normal. Gallbladder and biliary tree:  Unremarkable. Pancreas: Mild dilation of the pancreatic duct, most prominent in the pancreatic head where it measures 5 mm in greatest diameter. No pancreatic mass or inflammation. No evidence of a pancreatic laceration. Adrenal glands: Unremarkable. Kidneys, ureters, bladder: Sub cm upper pole low-density lesion in the right kidney consistent with cysts. No other renal masses. No stones. No hydronephrosis. No evidence of a renal contusion or laceration. Ureters are normal in course and in caliber. Bladder is unremarkable.  Lymph nodes: Mildly prominent periceliac and gastrohepatic ligament nodes, none pathologically enlarged by size criteria. There are few prominent left retroperitoneal lymph nodes, largest measuring 7 mm in short axis. Ascites/pneumoperitoneum: None. Gastrointestinal and mesentery: Stomach, small bowel are unremarkable. Few left colon diverticula. Colon otherwise normal. Normal appendix visualized. No evidence of a bowel wall hematoma or mesenteric hematoma. Abdominal wall: Small fat containing umbilical hernia. Otherwise unremarkable. MUSCULOSKELETAL Fracture along the anterior upper endplate of L1. Although there is no associated hematoma or significant edema, this does appear recent/ acute. No other fractures. Degenerative changes are noted along the visualized spine. There is a chronic pars defect on the right at L5-S1. No osteoblastic or osteolytic lesions. IMPRESSION: 1. Mild compression fracture of the upper endplate of L1, which appears recent/acute. This does not affect the spinal canal or neural foramina. 2. No other acute findings within the chest, abdomen or pelvis. 3. Hepatic steatosis. Mild dilation of pancreatic duct, presumed chronic. Electronically Signed   By: Amie Portland M.D.   On: 04/16/2015 14:59   I have personally reviewed and evaluated these images and lab results as part of my medical decision-making.   EKG Interpretation None      MDM   Final diagnoses:  Fall, initial encounter    CT scan of head chest and back shows mild compression fracture of L1. He will be followed up with orthopedics   Bethann Berkshire, MD 04/16/15 1616

## 2015-04-16 NOTE — ED Provider Notes (Signed)
I discussed the wrist x-ray findings with the patient. I have ordered a sugar tong splint,  Dr. Estell Harpinzammit gave the patient referral to Dr. August Saucerean. The patient was given a prescription for Percocet tablets.  Linwood DibblesJon Johnmark Geiger, MD 04/17/15 [phone]

## 2015-04-16 NOTE — Progress Notes (Signed)
Orthopedic Tech Progress Note Patient Details:  Sean [name] October 13, 1960 [id number]  Ortho Devices Type of Ortho Device: Ace wrap, Sugartong splint, Arm sling Ortho Device/Splint Location: LUE Ortho Device/Splint Interventions: Ordered, Application   Jennye MoccasinHughes, Bettyjane Shenoy [name] 04/16/2015, 5:38 PM

## 2015-04-16 NOTE — ED Notes (Signed)
Ortho tech paged and will come splint pt.

## 2015-04-16 NOTE — ED Notes (Signed)
Pt here for fall off of 10 foot ladder. sts fell on lower back and injured left wrist. Pt bit through tongue. sts hit head. Denies LOC.

## 2015-10-23 ENCOUNTER — Emergency Department (HOSPITAL_COMMUNITY): Payer: Self-pay

## 2015-10-23 ENCOUNTER — Emergency Department (HOSPITAL_COMMUNITY)
Admission: EM | Admit: 2015-10-23 | Discharge: 2015-10-23 | Disposition: A | Discharge status: Home / Self Care | Payer: Self-pay | Attending: Emergency Medicine | Admitting: Emergency Medicine

## 2015-10-23 ENCOUNTER — Encounter (HOSPITAL_COMMUNITY): Payer: Self-pay | Admitting: Emergency Medicine

## 2015-10-23 DIAGNOSIS — Y990 Civilian activity done for income or pay: Secondary | ICD-10-CM | POA: Insufficient documentation

## 2015-10-23 DIAGNOSIS — Z7982 Long term (current) use of aspirin: Secondary | ICD-10-CM | POA: Insufficient documentation

## 2015-10-23 DIAGNOSIS — S62632B Displaced fracture of distal phalanx of right middle finger, initial encounter for open fracture: Secondary | ICD-10-CM | POA: Insufficient documentation

## 2015-10-23 DIAGNOSIS — W28XXXA Contact with powered lawn mower, initial encounter: Secondary | ICD-10-CM | POA: Insufficient documentation

## 2015-10-23 DIAGNOSIS — Y929 Unspecified place or not applicable: Secondary | ICD-10-CM | POA: Insufficient documentation

## 2015-10-23 DIAGNOSIS — Y939 Activity, unspecified: Secondary | ICD-10-CM | POA: Insufficient documentation

## 2015-10-23 MED ORDER — OXYCODONE-ACETAMINOPHEN 5-325 MG PO TABS: 1.0000 | ORAL_TABLET | Freq: Four times a day (QID) | ORAL | Status: AC | PRN

## 2015-10-23 MED ORDER — LIDOCAINE HCL 2 % IJ SOLN
10.0000 mL | Freq: Once | INTRAMUSCULAR | Status: AC
  Administered 2015-10-23: 200 mg via INTRADERMAL
  Filled 2015-10-23: qty 20

## 2015-10-23 MED ORDER — CEPHALEXIN 500 MG PO CAPS: 1000.0000 mg | ORAL_CAPSULE | Freq: Two times a day (BID) | ORAL | Status: DC

## 2015-10-23 NOTE — ED Provider Notes (Signed)
CSN: [id number]     Arrival date & time 10/23/15  1934 History  By signing my name below, I, Emmanuella Mensah, attest that this documentation has been prepared under the direction and in the presence of Fayrene Helper, PA-C. Electronically Signed: Angelene Giovanni, ED Scribe. 10/23/2015. 10:01 PM.    Chief Complaint  Patient presents with  . Finger Injury   The history is provided by the patient. No language interpreter was used.   HPI Comments: Sean [name] is a 55 y.o. male who presents to the Emergency Department complaining of laceration to his right middle finger s/p finger injury that occurred approx. 6 hours ago. He explains that he was working on his lawnmower when his finger was caught in the belt of the lawnmower. Report acute onset of sharp throbbing pain that was initially intense but has improved throughout the day. No numbness. No alleviating factors noted. He states that he has tried cleaning area with alcohol and wrapped finger in bandage to control the bleeding. Pt states that he is right hand dominate. He reports that his tetanus vaccine is UTD. No fever, chills, or rash. 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    Past Medical History  Diagnosis Date  . 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 Elbow surgery    . Tonsillectomy    . Bilateral upper extremity angiogram N/A 01/15/2012    Procedure: BILATERAL UPPER EXTREMITY ANGIOGRAM;  Surgeon: Fransisco Hertz, MD;  Location: [facility] CATH LAB;  Service: Cardiovascular;  Laterality: N/A;   No family history on file. Social History  Substance Use Topics  . Smoking status: Never Smoker   . Smokeless tobacco: None  . Alcohol Use: 0.0 oz/week     Comment: 2 cans of beer daily    Review of Systems  Constitutional: Negative for fever and chills.  Skin: Positive for wound. Negative for rash.      Allergies  Review of patient's allergies indicates no  known allergies.  Home Medications   Prior to Admission medications   Medication Sig Start Date End Date Taking? Authorizing Provider  acetaminophen (TYLENOL) 500 MG tablet Take 1,000 mg by mouth every 6 (six) hours as needed for mild pain.    Historical Provider, MD  aspirin EC 325 MG tablet Take 1 tablet (325 mg total) by mouth daily. 01/19/12   Clydia Llano, MD  Aspirin-Salicylamide-Caffeine (BC HEADACHE) 325-95-16 MG TABS Take 1 Package by mouth every 6 (six) hours as needed (pain).    Historical Provider, MD  oxyCODONE-acetaminophen (PERCOCET/ROXICET) 5-325 MG tablet Take 1 tablet by mouth every 6 (six) hours as needed for severe pain. 04/16/15   Bethann Berkshire, MD   BP 131/87 mmHg  Pulse 84  Temp(Src) 98.2 F (36.8 C) (Oral)  Resp 18  SpO2 100% Physical Exam  Constitutional: He is oriented to person, place, and time. He appears well-developed and well-nourished. No distress.  HENT:  Head: Normocephalic and atraumatic.  Eyes: Conjunctivae and EOM are normal.  Neck: Neck supple. No tracheal deviation present.  Cardiovascular: Normal rate.   Pulmonary/Chest: Effort normal. No respiratory distress.  Musculoskeletal: Normal range of motion.  Right hand, middle finger: deep 3 cm lac noted to distal tip of finger with nail involvement and open skin, tip of finger with less than 2 sec cap refill  Neurological: He is alert and oriented to person, place, and time.  Skin: Skin  is warm and dry.  Psychiatric: He has a normal mood and affect. His behavior is normal.  Nursing note and vitals reviewed.   ED Course  Procedures (including critical care time) DIAGNOSTIC STUDIES: Oxygen Saturation is 100% on RA, normal by my interpretation.    COORDINATION OF CARE: 9:59 PM- Pt advised of plan for treatment and pt agrees. Pt informed of x-ray results. He will receive laceration repair.    Imaging Review Dg Finger Middle Right  10/23/2015  CLINICAL DATA:  Laceration to right middle finger  EXAM: RIGHT MIDDLE FINGER 2+V COMPARISON:  01/14/2012 FINDINGS: Laceration to the distal 3rd digit. Associated distal tuft fracture. No radiopaque foreign body is seen. Prior amputation of the 2nd digit at the level of the mid distal phalanx. The joint spaces are preserved. IMPRESSION: Laceration to the distal 3rd digit with associated distal tuft fracture. Electronically Signed   By: Charline Bills M.D.   On: 10/23/2015 20:03   Fayrene Helper, PA-C has personally reviewed and evaluated these images as part of his medical decision-making.  LACERATION REPAIR PROCEDURE NOTE The patient's identification was confirmed and consent was obtained. This procedure was performed by Fayrene Helper, PA-C at 10:01 PM. Site: right middle finger Sterile procedures observed Anesthetic used (type and amt): xylocaine w/o Epi 2% Suture type/size: none Length: 3 cm # of Sutures: none Technique: irrigation  Complexity: complex Antibx ointment applied  Tetanus UTD Site anesthetized, irrigated with NS, explored without evidence of foreign body but wound is dirty, wound well approximated, site covered with dry, sterile dressing.  Patient tolerated procedure well without complications. Instructions for care discussed verbally and patient provided with additional written instructions for homecare and f/u.         MDM   Final diagnoses:  Open displaced fracture of distal phalanx of right middle finger, initial encounter    BP 131/87 mmHg  Pulse 84  Temp(Src) 98.2 F (36.8 C) (Oral)  Resp 18  SpO2 100%   I personally performed the services described in this documentation, which was scribed in my presence. The recorded information has been reviewed and is accurate.     10:48 PM Pt with hx of Beugers disease here with R middle finger laceration from the belt of lawnmower >6 hrs ago.  Xray demonstrates open tuft fracture.  I have performed digital nerve block and have irrigate wound but it remains dirty.   I discussed with Dr. Clarene Duke and we agree we should contact on call hand specialist Dr. Mina Marble for further management.  Pt has been treated by Dr. Mina Marble in the past.    11:13 PM Appreciate consultation from hand specialist, DR. Weingold who request for the wound to be loosely approximate and sutured with Xeroform dressing and pt can f/u in office next week.  Pt d/c with abx, pain medication, and return precaution.    Fayrene Helper, PA-C 10/23/15 2314   Laurence Spates, MD 11/05/15 [phone]

## 2015-10-23 NOTE — ED Notes (Signed)
Pt states he cut his R middle finger on the belt of a lawnmower. Pt up to date on tetanus. Bleeding controlled.

## 2015-10-23 NOTE — Discharge Instructions (Signed)
Please call and follow up with Dr. Mina Marble on [place] for further care.  Take antibiotic for the full duration, take pain medication as needed.   Finger Fracture Finger fractures are breaks in the bones of the fingers. There are many types of fractures. There are also different ways of treating these fractures. Your doctor will talk with you about the best way to treat your fracture. Injury is the main cause of broken fingers. This includes:  Injuries while playing sports.  Workplace injuries.  Falls. HOME CARE  Follow your doctor's instructions for:  Activities.  Exercises.  Physical therapy.  Take medicines only as told by your doctor for pain, discomfort, or fever. GET HELP IF: You have pain or swelling that limits:  The motion of your fingers.  The use of your fingers. GET HELP RIGHT AWAY IF:  You cannot feel your fingers, or your fingers become numb.   This information is not intended to replace advice given to you by your health care provider. Make sure you discuss any questions you have with your health care provider.   Document Released: 09/06/2007 Document Revised: 04/10/2014 Document Reviewed: 10/30/2012 Elsevier Interactive Patient Education [employer]

## 2016-07-30 ENCOUNTER — Emergency Department (HOSPITAL_COMMUNITY): Payer: Self-pay

## 2016-07-30 ENCOUNTER — Encounter (HOSPITAL_COMMUNITY): Payer: Self-pay | Admitting: *Deleted

## 2016-07-30 ENCOUNTER — Emergency Department (HOSPITAL_COMMUNITY)
Admission: EM | Admit: 2016-07-30 | Discharge: 2016-07-30 | Disposition: A | Discharge status: Home / Self Care | Payer: Self-pay | Attending: Emergency Medicine | Admitting: Emergency Medicine

## 2016-07-30 DIAGNOSIS — Y92410 Unspecified street and highway as the place of occurrence of the external cause: Secondary | ICD-10-CM | POA: Insufficient documentation

## 2016-07-30 DIAGNOSIS — Z7982 Long term (current) use of aspirin: Secondary | ICD-10-CM | POA: Insufficient documentation

## 2016-07-30 DIAGNOSIS — S2231XA Fracture of one rib, right side, initial encounter for closed fracture: Secondary | ICD-10-CM | POA: Insufficient documentation

## 2016-07-30 DIAGNOSIS — Y939 Activity, unspecified: Secondary | ICD-10-CM | POA: Insufficient documentation

## 2016-07-30 DIAGNOSIS — Y999 Unspecified external cause status: Secondary | ICD-10-CM | POA: Insufficient documentation

## 2016-07-30 DIAGNOSIS — S2239XA Fracture of one rib, unspecified side, initial encounter for closed fracture: Secondary | ICD-10-CM

## 2016-07-30 MED ORDER — HYDROCODONE-ACETAMINOPHEN 5-325 MG PO TABS: 2.0000 | ORAL_TABLET | ORAL | 0 refills | Status: AC | PRN

## 2016-07-30 MED ORDER — IBUPROFEN 800 MG PO TABS: 800.0000 mg | ORAL_TABLET | Freq: Three times a day (TID) | ORAL | 0 refills | Status: AC

## 2016-07-30 MED ORDER — HYDROCODONE-ACETAMINOPHEN 5-325 MG PO TABS
2.0000 | ORAL_TABLET | Freq: Once | ORAL | Status: AC
  Administered 2016-07-30: 2 via ORAL
  Filled 2016-07-30: qty 2

## 2016-07-30 NOTE — ED Triage Notes (Signed)
PT stopped taking BP meds 10 years ago. Pt was last seen for high BP at [facility] hospital 10 years ago.
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
## 2016-07-30 NOTE — Discharge Instructions (Signed)
Return if any problems. See your Physician for recheck next week  

## 2016-09-16 ENCOUNTER — Emergency Department (HOSPITAL_COMMUNITY): Payer: Self-pay

## 2016-09-16 ENCOUNTER — Inpatient Hospital Stay (HOSPITAL_COMMUNITY)
Admission: EM | Admit: 2016-09-16 | Discharge: 2016-10-01 | DRG: 964 | Disposition: E | Payer: Self-pay | Attending: General Surgery | Admitting: General Surgery

## 2016-09-16 ENCOUNTER — Encounter (HOSPITAL_COMMUNITY): Payer: Self-pay | Admitting: Emergency Medicine

## 2016-09-16 DIAGNOSIS — Z66 Do not resuscitate: Secondary | ICD-10-CM | POA: Diagnosis not present

## 2016-09-16 DIAGNOSIS — S2243XA Multiple fractures of ribs, bilateral, initial encounter for closed fracture: Secondary | ICD-10-CM | POA: Diagnosis present

## 2016-09-16 DIAGNOSIS — R402432 Glasgow coma scale score 3-8, at arrival to emergency department: Secondary | ICD-10-CM | POA: Diagnosis present

## 2016-09-16 DIAGNOSIS — Z515 Encounter for palliative care: Secondary | ICD-10-CM | POA: Diagnosis present

## 2016-09-16 DIAGNOSIS — S0219XA Other fracture of base of skull, initial encounter for closed fracture: Principal | ICD-10-CM | POA: Diagnosis present

## 2016-09-16 DIAGNOSIS — S42002A Fracture of unspecified part of left clavicle, initial encounter for closed fracture: Secondary | ICD-10-CM | POA: Diagnosis present

## 2016-09-16 DIAGNOSIS — S0630AA Unspecified focal traumatic brain injury with loss of consciousness status unknown, initial encounter: Secondary | ICD-10-CM

## 2016-09-16 DIAGNOSIS — S06309A Unspecified focal traumatic brain injury with loss of consciousness of unspecified duration, initial encounter: Secondary | ICD-10-CM

## 2016-09-16 DIAGNOSIS — D62 Acute posthemorrhagic anemia: Secondary | ICD-10-CM | POA: Diagnosis present

## 2016-09-16 DIAGNOSIS — S066X9A Traumatic subarachnoid hemorrhage with loss of consciousness of unspecified duration, initial encounter: Secondary | ICD-10-CM | POA: Diagnosis present

## 2016-09-16 DIAGNOSIS — I469 Cardiac arrest, cause unspecified: Secondary | ICD-10-CM | POA: Diagnosis present

## 2016-09-16 DIAGNOSIS — J939 Pneumothorax, unspecified: Secondary | ICD-10-CM

## 2016-09-16 DIAGNOSIS — Y92410 Unspecified street and highway as the place of occurrence of the external cause: Secondary | ICD-10-CM

## 2016-09-16 DIAGNOSIS — T1490XA Injury, unspecified, initial encounter: Secondary | ICD-10-CM

## 2016-09-16 DIAGNOSIS — Z9911 Dependence on respirator [ventilator] status: Secondary | ICD-10-CM

## 2016-09-16 DIAGNOSIS — S0990XA Unspecified injury of head, initial encounter: Secondary | ICD-10-CM

## 2016-09-16 DIAGNOSIS — S02109A Fracture of base of skull, unspecified side, initial encounter for closed fracture: Secondary | ICD-10-CM

## 2016-09-16 DIAGNOSIS — S02411A LeFort I fracture, initial encounter for closed fracture: Secondary | ICD-10-CM | POA: Diagnosis present

## 2016-09-16 DIAGNOSIS — S0292XA Unspecified fracture of facial bones, initial encounter for closed fracture: Secondary | ICD-10-CM

## 2016-09-16 DIAGNOSIS — S12000A Unspecified displaced fracture of first cervical vertebra, initial encounter for closed fracture: Secondary | ICD-10-CM

## 2016-09-16 DIAGNOSIS — S271XXA Traumatic hemothorax, initial encounter: Secondary | ICD-10-CM | POA: Diagnosis present

## 2016-09-16 DIAGNOSIS — S065X9A Traumatic subdural hemorrhage with loss of consciousness of unspecified duration, initial encounter: Secondary | ICD-10-CM | POA: Diagnosis present

## 2016-09-16 LAB — CBC WITH DIFFERENTIAL/PLATELET
BASOS ABS: 0 10*3/uL (ref 0.0–0.1)
BASOS PCT: 0 %
EOS ABS: 0 10*3/uL (ref 0.0–0.7)
EOS PCT: 1 %
HCT: 32.1 % — ABNORMAL LOW (ref 39.0–52.0)
HEMOGLOBIN: 10.3 g/dL — AB (ref 13.0–17.0)
LYMPHS ABS: 2.8 10*3/uL (ref 0.7–4.0)
Lymphocytes Relative: 38 %
MCH: 30.4 pg (ref 26.0–34.0)
MCHC: 32.1 g/dL (ref 30.0–36.0)
MCV: 94.7 fL (ref 78.0–100.0)
Monocytes Absolute: 0.6 10*3/uL (ref 0.1–1.0)
Monocytes Relative: 9 %
NEUTROS PCT: 52 %
Neutro Abs: 3.8 10*3/uL (ref 1.7–7.7)
PLATELETS: 314 10*3/uL (ref 150–400)
RBC: 3.39 MIL/uL — AB (ref 4.22–5.81)
RDW: 15.4 % (ref 11.5–15.5)
WBC: 7.3 10*3/uL (ref 4.0–10.5)

## 2016-09-16 LAB — PROTIME-INR
INR: 1.89
Prothrombin Time: 22 seconds — ABNORMAL HIGH (ref 11.4–15.2)

## 2016-09-16 LAB — I-STAT CHEM 8, ED
BUN: 6 mg/dL (ref 6–20)
CALCIUM ION: 1.04 mmol/L — AB (ref 1.15–1.40)
CHLORIDE: 95 mmol/L — AB (ref 101–111)
Creatinine, Ser: 1.6 mg/dL — ABNORMAL HIGH (ref 0.61–1.24)
GLUCOSE: 162 mg/dL — AB (ref 65–99)
HCT: 33 % — ABNORMAL LOW (ref 39.0–52.0)
Hemoglobin: 11.2 g/dL — ABNORMAL LOW (ref 13.0–17.0)
POTASSIUM: 3.2 mmol/L — AB (ref 3.5–5.1)
Sodium: 136 mmol/L (ref 135–145)
TCO2: 23 mmol/L (ref 0–100)

## 2016-09-16 LAB — COMPREHENSIVE METABOLIC PANEL
ALBUMIN: 2.7 g/dL — AB (ref 3.5–5.0)
ALK PHOS: 64 U/L (ref 38–126)
ALT: 40 U/L (ref 17–63)
AST: 120 U/L — AB (ref 15–41)
Anion gap: 13 (ref 5–15)
BUN: 5 mg/dL — AB (ref 6–20)
CALCIUM: 8 mg/dL — AB (ref 8.9–10.3)
CHLORIDE: 94 mmol/L — AB (ref 101–111)
CO2: 22 mmol/L (ref 22–32)
CREATININE: 1.51 mg/dL — AB (ref 0.61–1.24)
GFR calc Af Amer: 58 mL/min — ABNORMAL LOW (ref >60)
GFR calc non Af Amer: 50 mL/min — ABNORMAL LOW (ref >60)
GLUCOSE: 163 mg/dL — AB (ref 65–99)
Potassium: 3.4 mmol/L — ABNORMAL LOW (ref 3.5–5.1)
SODIUM: 129 mmol/L — AB (ref 135–145)
Total Bilirubin: 0.8 mg/dL (ref 0.3–1.2)
Total Protein: 7.8 g/dL (ref 6.5–8.1)

## 2016-09-16 MED ORDER — LACTATED RINGERS IV SOLN
INTRAVENOUS | Status: AC | PRN
  Administered 2016-09-16: 1000 mL via INTRAVENOUS

## 2016-09-16 MED ORDER — SODIUM CHLORIDE 0.9 % IV SOLN
INTRAVENOUS | Status: AC | PRN
  Administered 2016-09-16: 999 mL/h via INTRAVENOUS
  Administered 2016-09-16: 1000 mL via INTRAVENOUS

## 2016-09-16 MED ORDER — SODIUM CHLORIDE 0.9 % IV BOLUS (SEPSIS)
1000.0000 mL | Freq: Once | INTRAVENOUS | Status: AC
  Administered 2016-09-16: 1000 mL via INTRAVENOUS

## 2016-09-16 MED ORDER — ORAL CARE MOUTH RINSE: 15.0000 mL | Freq: Four times a day (QID) | OROMUCOSAL | Status: DC

## 2016-09-16 MED ORDER — POTASSIUM CHLORIDE IN NACL 40-0.9 MEQ/L-% IV SOLN
INTRAVENOUS | Status: DC
  Administered 2016-09-17 (×2): 125 mL/h via INTRAVENOUS
  Filled 2016-09-16 (×2): qty 1000

## 2016-09-16 MED ORDER — CHLORHEXIDINE GLUCONATE 0.12% ORAL RINSE (MEDLINE KIT)
15.0000 mL | Freq: Two times a day (BID) | OROMUCOSAL | Status: DC
  Administered 2016-09-17: 15 mL via OROMUCOSAL

## 2016-09-16 MED ORDER — IOPAMIDOL (ISOVUE-300) INJECTION 61%: 100.0000 mL | Freq: Once | INTRAVENOUS | Status: DC | PRN

## 2016-09-16 NOTE — ED Notes (Signed)
IO removed at this time
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
## 2016-09-16 NOTE — ED Notes (Signed)
EDP at bedside inserting left chest tube on pt.
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
## 2016-09-16 NOTE — Progress Notes (Signed)
Pt transported to CT and back with no complications noted. 

## 2016-09-17 ENCOUNTER — Inpatient Hospital Stay (HOSPITAL_COMMUNITY): Payer: Self-pay

## 2016-09-17 LAB — PREPARE FRESH FROZEN PLASMA
UNIT DIVISION: 0
Unit division: 0

## 2016-09-17 LAB — TYPE AND SCREEN
ABO/RH(D): A POS
ANTIBODY SCREEN: NEGATIVE
Unit division: 0
Unit division: 0

## 2016-09-17 LAB — BPAM RBC
Blood Product Expiration Date: 201806212359
Blood Product Expiration Date: 201807022359
ISSUE DATE / TIME: 201806162207
ISSUE DATE / TIME: 201806162207
UNIT TYPE AND RH: 9500
UNIT TYPE AND RH: 9500

## 2016-09-17 LAB — URINALYSIS, ROUTINE W REFLEX MICROSCOPIC
BILIRUBIN URINE: NEGATIVE
Glucose, UA: NEGATIVE mg/dL
Ketones, ur: NEGATIVE mg/dL
LEUKOCYTES UA: NEGATIVE
Nitrite: NEGATIVE
PH: 5 (ref 5.0–8.0)
Protein, ur: NEGATIVE mg/dL
SPECIFIC GRAVITY, URINE: 1.016 (ref 1.005–1.030)
SQUAMOUS EPITHELIAL / LPF: NONE SEEN

## 2016-09-17 LAB — BASIC METABOLIC PANEL
Anion gap: 12 (ref 5–15)
BUN: 6 mg/dL (ref 6–20)
CHLORIDE: 105 mmol/L (ref 101–111)
CO2: 17 mmol/L — AB (ref 22–32)
CREATININE: 1.38 mg/dL — AB (ref 0.61–1.24)
Calcium: 6.8 mg/dL — ABNORMAL LOW (ref 8.9–10.3)
GFR calc Af Amer: >60 mL/min (ref >60)
GFR calc non Af Amer: 56 mL/min — ABNORMAL LOW (ref >60)
Glucose, Bld: 127 mg/dL — ABNORMAL HIGH (ref 65–99)
POTASSIUM: 3 mmol/L — AB (ref 3.5–5.1)
Sodium: 134 mmol/L — ABNORMAL LOW (ref 135–145)

## 2016-09-17 LAB — I-STAT ARTERIAL BLOOD GAS, ED
Acid-base deficit: 8 mmol/L — ABNORMAL HIGH (ref 0.0–2.0)
BICARBONATE: 18.7 mmol/L — AB (ref 20.0–28.0)
O2 Saturation: 99 %
PCO2 ART: 41 mmHg (ref 32.0–48.0)
Patient temperature: 98.6
TCO2: 20 mmol/L (ref 0–100)
pH, Arterial: 7.266 — ABNORMAL LOW (ref 7.350–7.450)
pO2, Arterial: 134 mmHg — ABNORMAL HIGH (ref 83.0–108.0)

## 2016-09-17 LAB — CBC
HEMATOCRIT: 24.3 % — AB (ref 39.0–52.0)
HEMOGLOBIN: 7.7 g/dL — AB (ref 13.0–17.0)
MCH: 29.6 pg (ref 26.0–34.0)
MCHC: 31.7 g/dL (ref 30.0–36.0)
MCV: 93.5 fL (ref 78.0–100.0)
Platelets: 282 10*3/uL (ref 150–400)
RBC: 2.6 MIL/uL — AB (ref 4.22–5.81)
RDW: 15.5 % (ref 11.5–15.5)
WBC: 16.4 10*3/uL — ABNORMAL HIGH (ref 4.0–10.5)

## 2016-09-17 LAB — BPAM FFP
BLOOD PRODUCT EXPIRATION DATE: 201806182359
Blood Product Expiration Date: 201806182359
ISSUE DATE / TIME: 201806162208
ISSUE DATE / TIME: 201806162208
Unit Type and Rh: 600
Unit Type and Rh: 6200

## 2016-09-17 LAB — PROTIME-INR
INR: 2.1
Prothrombin Time: 23.9 seconds — ABNORMAL HIGH (ref 11.4–15.2)

## 2016-09-17 LAB — HIV ANTIBODY (ROUTINE TESTING W REFLEX): HIV Screen 4th Generation wRfx: NONREACTIVE

## 2016-09-17 LAB — ABO/RH: ABO/RH(D): A POS

## 2016-09-17 MED ORDER — SODIUM CHLORIDE 0.9 % IV BOLUS (SEPSIS)
1000.0000 mL | Freq: Once | INTRAVENOUS | Status: AC
  Administered 2016-09-17: 1000 mL via INTRAVENOUS

## 2016-09-17 MED ORDER — PHENYLEPHRINE HCL 10 MG/ML IJ SOLN
0.0000 ug/min | INTRAMUSCULAR | Status: DC
  Administered 2016-09-17: 220 ug/min via INTRAVENOUS
  Administered 2016-09-17: 20 ug/min via INTRAVENOUS
  Administered 2016-09-17 (×4): 400 ug/min via INTRAVENOUS
  Administered 2016-09-17: 40 ug/min via INTRAVENOUS
  Administered 2016-09-17 (×3): 400 ug/min via INTRAVENOUS
  Filled 2016-09-17 (×11): qty 1

## 2016-09-17 MED ORDER — SODIUM CHLORIDE 0.9 % IV BOLUS (SEPSIS)
1000.0000 mL | Freq: Once | INTRAVENOUS | Status: AC
Start: 2016-09-17 — End: 2016-09-17
  Administered 2016-09-17: 1000 mL via INTRAVENOUS

## 2016-09-17 MED ORDER — ORAL CARE MOUTH RINSE: 15.0000 mL | OROMUCOSAL | Status: DC

## 2016-09-17 MED ORDER — CHLORHEXIDINE GLUCONATE 0.12% ORAL RINSE (MEDLINE KIT)
15.0000 mL | Freq: Two times a day (BID) | OROMUCOSAL | Status: DC
  Administered 2016-09-17: 15 mL via OROMUCOSAL

## 2016-09-19 ENCOUNTER — Encounter (HOSPITAL_COMMUNITY): Payer: Self-pay | Admitting: *Deleted

## 2016-10-01 NOTE — ED Notes (Signed)
Dr. Bebe ShaggyWickline notified on pt.'s hypotension .
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
## 2016-10-01 NOTE — Progress Notes (Signed)
Paged Dr. Lindie SpruceWyatt re: Neo maxed out at 42700mcg/min, SBP remains in 70s, MAP <65. Will not add additional pressors at this time, will continue to closely monitor pt. Dr. Lindie SpruceWyatt to come see pt family at bedside.

## 2016-10-01 NOTE — ED Notes (Signed)
Dr. Fabiola Backerabel ( neurosurgeon ) explained tests results, prognosis and plan of care to pt.
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
## 2016-10-01 NOTE — Progress Notes (Signed)
Family has decided to withdraw support  Marta LamasJames O. Gae BonWyatt, III, MD, FACS [phone] Trauma Surgeon

## 2016-10-01 NOTE — Procedures (Signed)
Extubation Procedure Note  Patient Details:   Name: Altamese CarolinaGaston Ccc Caples DOB: [birth year] MRN: [id number]   Airway Documentation:     Evaluation  O2 sats: stable throughout Complications: No apparent complications Patient did tolerate procedure well. Bilateral Breath Sounds: Rhonchi, Coarse crackles   No   Terminal withdrawal.  Family present.  Forest BeckerJean S Raela Bohl 09/04/2016, 12:19 PM

## 2016-10-01 DEATH — deceased
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
## 2018-01-18 IMAGING — DX DG CHEST 1V PORT
1 series · 1 of 1 positions shown · non-contrast
Comparison: 09/16/2016

CLINICAL DATA: Ventilator dependence.  Pneumothorax

EXAM:
PORTABLE CHEST 1 VIEW

[chest ap]
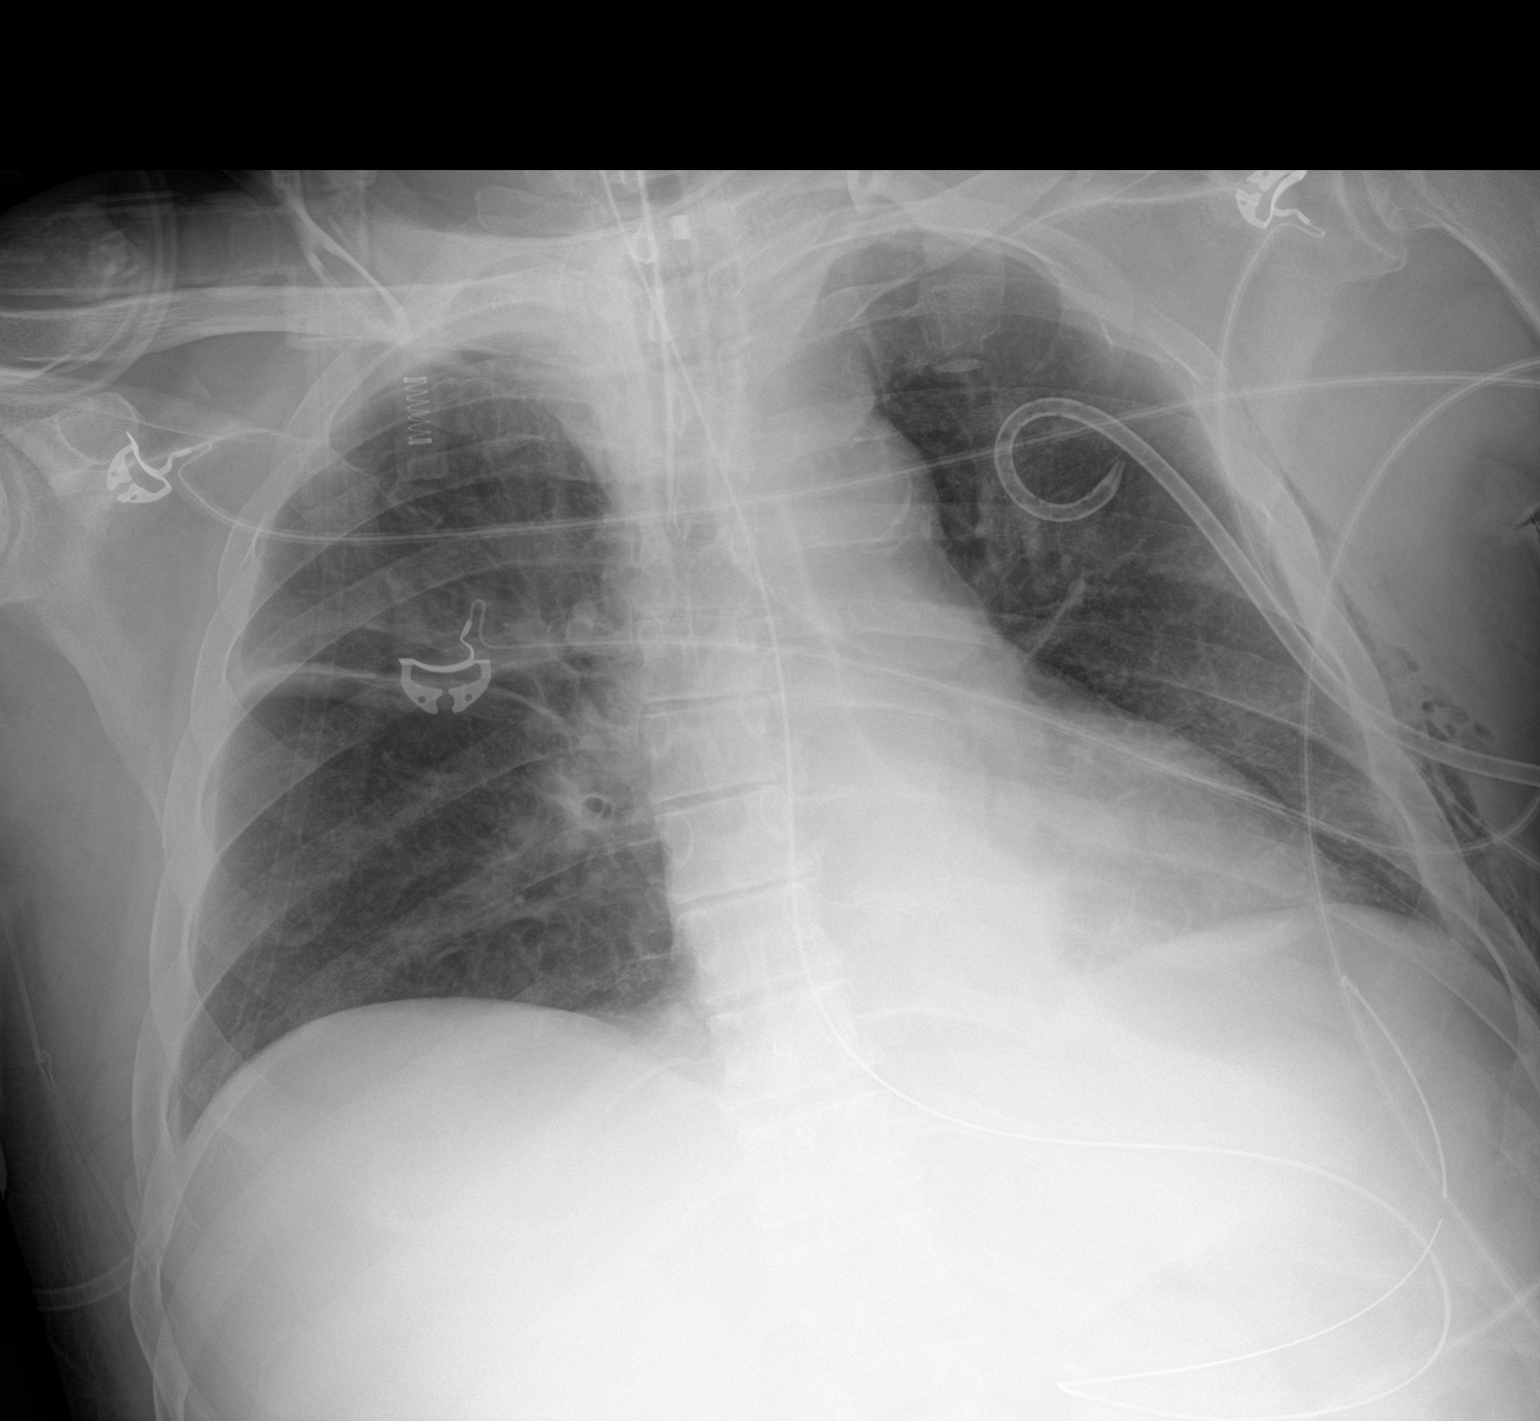

[1 of 1 positions shown; findings below may reference images not displayed]

FINDINGS: Interval placement of pigtail catheter in the left chest. Tiny left
apical pneumothorax. No significant effusion.

Endotracheal tube in good position. NG tube is been advanced to the
stomach.

Mild bibasilar atelectasis, with progression.

Fracture of the left clavicle. Multiple left rib fractures. Right
lateral rib fracture
IMPRESSION: Interval placement of pigtail catheter in the left chest. Tiny left
apical pneumothorax. No significant pleural effusion

Mild bibasilar atelectasis, with progression from yesterday.
